# Patient Record
Sex: Female | Born: 1985 | Race: Black or African American | Hispanic: No | Marital: Single | Smoking: Current some day smoker
Health system: Southern US, Community
[De-identification: ages and names within clinical notes are randomized; demographics above are authoritative.]

## PROBLEM LIST (undated history)

## (undated) ENCOUNTER — Ambulatory Visit: Payer: 59 | Source: Home / Self Care

## (undated) DIAGNOSIS — Z8489 Family history of other specified conditions: Secondary | ICD-10-CM

## (undated) DIAGNOSIS — E079 Disorder of thyroid, unspecified: Secondary | ICD-10-CM

## (undated) DIAGNOSIS — K219 Gastro-esophageal reflux disease without esophagitis: Secondary | ICD-10-CM

## (undated) DIAGNOSIS — J302 Other seasonal allergic rhinitis: Secondary | ICD-10-CM

## (undated) DIAGNOSIS — R519 Headache, unspecified: Secondary | ICD-10-CM

## (undated) DIAGNOSIS — R7303 Prediabetes: Secondary | ICD-10-CM

## (undated) DIAGNOSIS — J189 Pneumonia, unspecified organism: Secondary | ICD-10-CM

## (undated) DIAGNOSIS — F419 Anxiety disorder, unspecified: Secondary | ICD-10-CM

## (undated) DIAGNOSIS — F32A Depression, unspecified: Secondary | ICD-10-CM

## (undated) DIAGNOSIS — D573 Sickle-cell trait: Secondary | ICD-10-CM

---

## 2007-02-13 HISTORY — PX: DILATION AND CURETTAGE OF UTERUS: SHX78

## 2009-11-21 ENCOUNTER — Emergency Department (HOSPITAL_BASED_OUTPATIENT_CLINIC_OR_DEPARTMENT_OTHER): Admission: EM | Admit: 2009-11-21 | Discharge: 2009-11-21 | Payer: Self-pay | Admitting: Emergency Medicine

## 2010-02-15 ENCOUNTER — Emergency Department (HOSPITAL_BASED_OUTPATIENT_CLINIC_OR_DEPARTMENT_OTHER)
Admission: EM | Admit: 2010-02-15 | Discharge: 2010-02-15 | Payer: Self-pay | Source: Home / Self Care | Admitting: Emergency Medicine

## 2011-03-24 ENCOUNTER — Emergency Department (HOSPITAL_BASED_OUTPATIENT_CLINIC_OR_DEPARTMENT_OTHER)
Admission: EM | Admit: 2011-03-24 | Discharge: 2011-03-25 | Disposition: A | Discharge status: Home / Self Care | Payer: PRIVATE HEALTH INSURANCE | Attending: Emergency Medicine | Admitting: Emergency Medicine

## 2011-03-24 ENCOUNTER — Encounter (HOSPITAL_BASED_OUTPATIENT_CLINIC_OR_DEPARTMENT_OTHER): Payer: Self-pay | Admitting: *Deleted

## 2011-03-24 ENCOUNTER — Emergency Department (INDEPENDENT_AMBULATORY_CARE_PROVIDER_SITE_OTHER): Payer: PRIVATE HEALTH INSURANCE

## 2011-03-24 DIAGNOSIS — J189 Pneumonia, unspecified organism: Secondary | ICD-10-CM

## 2011-03-24 DIAGNOSIS — J45909 Unspecified asthma, uncomplicated: Secondary | ICD-10-CM | POA: Insufficient documentation

## 2011-03-24 DIAGNOSIS — R05 Cough: Secondary | ICD-10-CM

## 2011-03-24 DIAGNOSIS — R0609 Other forms of dyspnea: Secondary | ICD-10-CM

## 2011-03-24 DIAGNOSIS — Z79899 Other long term (current) drug therapy: Secondary | ICD-10-CM | POA: Insufficient documentation

## 2011-03-24 DIAGNOSIS — J111 Influenza due to unidentified influenza virus with other respiratory manifestations: Secondary | ICD-10-CM | POA: Insufficient documentation

## 2011-03-24 DIAGNOSIS — R918 Other nonspecific abnormal finding of lung field: Secondary | ICD-10-CM

## 2011-03-24 HISTORY — DX: Other seasonal allergic rhinitis: J30.2

## 2011-03-24 HISTORY — DX: Sickle-cell trait: D57.3

## 2011-03-24 LAB — URINALYSIS, ROUTINE W REFLEX MICROSCOPIC
Glucose, UA: NEGATIVE mg/dL
Protein, ur: NEGATIVE mg/dL

## 2011-03-24 LAB — PREGNANCY, URINE: Preg Test, Ur: NEGATIVE

## 2011-03-24 LAB — URINE MICROSCOPIC-ADD ON

## 2011-03-24 MED ORDER — NAPROXEN 250 MG PO TABS
500.0000 mg | ORAL_TABLET | Freq: Once | ORAL | Status: AC
  Administered 2011-03-25: 500 mg via ORAL

## 2011-03-24 MED ORDER — ALBUTEROL SULFATE (5 MG/ML) 0.5% IN NEBU
5.0000 mg | INHALATION_SOLUTION | Freq: Once | RESPIRATORY_TRACT | Status: AC
  Administered 2011-03-24: 5 mg via RESPIRATORY_TRACT
  Filled 2011-03-24: qty 1

## 2011-03-24 MED ORDER — ALBUTEROL SULFATE HFA 108 (90 BASE) MCG/ACT IN AERS
2.0000 | INHALATION_SPRAY | RESPIRATORY_TRACT | Status: DC | PRN
  Administered 2011-03-24: 2 via RESPIRATORY_TRACT
  Filled 2011-03-24: qty 6.7

## 2011-03-24 MED ORDER — IPRATROPIUM BROMIDE 0.02 % IN SOLN
0.5000 mg | Freq: Once | RESPIRATORY_TRACT | Status: AC
  Administered 2011-03-24: 0.5 mg via RESPIRATORY_TRACT
  Filled 2011-03-24: qty 2.5

## 2011-03-24 MED ORDER — AZITHROMYCIN 250 MG PO TABS: ORAL_TABLET | ORAL | Status: DC

## 2011-03-24 NOTE — ED Notes (Signed)
Pt has had cough, sneezing, body aches,N/V, temp 101-104 since Monday. Son was also sick with similar s/s.

## 2011-03-24 NOTE — ED Provider Notes (Addendum)
History    Scribed for Hanley Seamen, MD, the patient was seen in room MH10/MH10. This chart was scribed by Katha Cabal.   CSN: [id number]  Arrival date & time 03/24/11  2035   First MD Initiated Contact with Patient 03/24/11 2311      Chief Complaint  Patient presents with  . Cough    (Consider location/radiation/quality/duration/timing/severity/associated sxs/prior treatment) Patient is a 26 y.o. female presenting with cough. The history is provided by the patient and a parent. No language interpreter was used.  Cough This is a new problem. Episode onset: about 6 days ago. The problem occurs every few minutes. The problem has not changed since onset.The maximum temperature recorded prior to her arrival was 102 to 102.9 F. The fever has been present for 5 days or more. Associated symptoms include rhinorrhea, myalgias and shortness of breath. Associated symptoms comments: Upper upper back pain, sneezing, congestion, . Her past medical history is significant for asthma.   Patient reports pleuritic pain.  There has been no diarrhea or changes in appetite.  Patient's mother reports that the patient's son was sick recently with similar symptoms and was seen in ED.     Past Medical History  Diagnosis Date  . Asthma   . Seasonal allergies   . Sickle cell trait     History reviewed. No pertinent past surgical history.  History reviewed. No pertinent family history.  History  Substance Use Topics  . Smoking status: Never Smoker   . Smokeless tobacco: Not on file  . Alcohol Use: No    OB History    Grav Para Term Preterm Abortions TAB SAB Ect Mult Living                  Review of Systems  HENT: Positive for rhinorrhea.   Respiratory: Positive for cough and shortness of breath.   Gastrointestinal: Positive for nausea. Negative for diarrhea.  Musculoskeletal: Positive for myalgias.  All other systems reviewed and are negative.    Allergies  Chocolate and Wine  Home  Medications   Current Outpatient Rx  Name Route Sig Dispense Refill  . ALBUTEROL SULFATE HFA 108 (90 BASE) MCG/ACT IN AERS Inhalation Inhale 2 puffs into the lungs every 6 (six) hours as needed. For shortness of breath    . GUAIFENESIN-DM 100-10 MG/5ML PO SYRP Oral Take 10 mLs by mouth 3 (three) times daily as needed. For cough    . MONTELUKAST SODIUM 10 MG PO TABS Oral Take 10 mg by mouth at bedtime.    . ADULT MULTIVITAMIN W/MINERALS CH Oral Take 1 tablet by mouth daily.    Marland Kitchen NAPROXEN SODIUM 220 MG PO TABS Oral Take 440 mg by mouth once as needed. For pain    . PHENYLEPH-CPM-DM-APAP 06-13-08-325 MG PO CAPS Oral Take 2 capsules by mouth once as needed. For cold symptoms    . PHENYLEPH-CPM-DM-ASPIRIN 7.09-13-08-325 MG PO TBEF Oral Take 2 tablets by mouth once as needed. For cold symptoms      BP 104/57  Pulse 76  Temp(Src) 99.6 F (37.6 C) (Oral)  Resp 18  Ht 5' (1.524 m)  Wt 155 lb (70.308 kg)  BMI 30.27 kg/m2  SpO2 98%  LMP 03/04/2011  Physical Exam   General: Well-developed, well-nourished female in no acute distress; appearance consistent with age of record HENT: normocephalic, atraumatic, Mild dysphonia, mild pharyngeal erythema without exudate,  Eyes: pupils equal round and reactive to light; extraocular muscles intact Neck: supple Heart: regular rate  and rhythm; no murmurs, rubs or gallops Lungs:  decreased air movement without frank wheezing,  Abdomen: soft; nondistended; nontender; no masses or hepatosplenomegaly; bowel sounds present Extremities: No deformity; full range of motion; pulses normal Neurologic: Awake, alert and oriented; motor function intact in all extremities and symmetric; no facial droop Skin: Warm and dry Psychiatric: Normal mood and affect   ED Course  Procedures (including critical care time)   DIAGNOSTIC STUDIES: Oxygen Saturation is 98% on room air, normal by my interpretation.     COORDINATION OF CARE: 11:14 PM  Advised patient of  radiological findings.   11:18 PM  Physical exam complete.   11:30 PM  Proventil and Atrovent nebulizer treatment ordered.        MDM   Nursing notes and vitals signs, including pulse oximetry, reviewed.  Summary of this visit's results, reviewed by myself:  Labs:  Results for orders placed during the hospital encounter of 03/24/11  URINALYSIS, ROUTINE W REFLEX MICROSCOPIC      Component Value Range   Color, Urine YELLOW  YELLOW    APPearance CLEAR  CLEAR    Specific Gravity, Urine 1.017  1.005 - 1.030    pH 5.5  5.0 - 8.0    Glucose, UA NEGATIVE  NEGATIVE (mg/dL)   Hgb urine dipstick NEGATIVE  NEGATIVE    Bilirubin Urine NEGATIVE  NEGATIVE    Ketones, ur NEGATIVE  NEGATIVE (mg/dL)   Protein, ur NEGATIVE  NEGATIVE (mg/dL)   Urobilinogen, UA 0.2  0.0 - 1.0 (mg/dL)   Nitrite NEGATIVE  NEGATIVE    Leukocytes, UA TRACE (*) NEGATIVE   PREGNANCY, URINE      Component Value Range   Preg Test, Ur NEGATIVE  NEGATIVE   URINE MICROSCOPIC-ADD ON      Component Value Range   Squamous Epithelial / LPF FEW (*) RARE    WBC, UA 3-6  <3 (WBC/hpf)   RBC / HPF 0-2  <3 (RBC/hpf)   Bacteria, UA RARE  RARE     Imaging Studies: Dg Chest 2 View  03/24/2011  *RADIOLOGY REPORT*  Clinical Data: Cough.  Difficulty breathing.  CHEST - 2 VIEW  Comparison: None.  Findings: Shallow inspiration.  Normal heart size and pulmonary vascularity.  There is a focal airspace disease in the left lower lung fairly, obscuring a portion of the left hemidiaphragm.  This suggests focal pneumonia.  No pleural effusion.  No pneumothorax.  IMPRESSION: Focal area of infiltration in the anterior left lower lung.  Original Report Authenticated By: Marlon Pel, M.D.    I personally performed the services described in this documentation, which was scribed in my presence.  The recorded information has been reviewed and considered.       MEDICATIONS GIVEN IN THE E.D. Scheduled Meds:    . albuterol  5 mg  Nebulization Once  . ipratropium  0.5 mg Nebulization Once   11:41 PM Improved air movement her albuterol and Atrovent neb treatment.         Hanley Seamen, MD 03/24/11 2337  Hanley Seamen, MD 03/24/11 [phone]

## 2011-03-25 MED ORDER — NAPROXEN 250 MG PO TABS
ORAL_TABLET | ORAL | Status: AC
  Administered 2011-03-25: 500 mg via ORAL
  Filled 2011-03-25: qty 2

## 2011-03-26 ENCOUNTER — Telehealth: Payer: Self-pay | Admitting: Family Medicine

## 2011-03-26 NOTE — Telephone Encounter (Signed)
To: [facility] (Daytime Triage) Fax: [phone] From: [facility] Date/ Time: 03/26/2011 9:27 AM Taken By: Thomasena Edis, CSR Caller: Alvy Beal Facility: not collected Patient: Monica Sloan, Monica Sloan DOB: 1986/01/10 Phone: [phone] Reason for Call: Patient was seen in the ED for Pneumonia and her work needs a more specific note for light duty she has a follow up on the 5th of March and wants to know about getting a note for her employer. Regarding Appointment: Appt Date: Appt Time: Unknown Provider: Reason: Details

## 2011-03-26 NOTE — Telephone Encounter (Signed)
msg left to call the office     KP 

## 2011-03-26 NOTE — Telephone Encounter (Signed)
Pt may need f/u here

## 2011-03-26 NOTE — Telephone Encounter (Signed)
Pt called back stating that she needs a note for work that specifies her work limitation due to recent illness. Pt scheduled to come in to office on tomorrow for evaluation.

## 2011-03-27 ENCOUNTER — Ambulatory Visit (INDEPENDENT_AMBULATORY_CARE_PROVIDER_SITE_OTHER): Payer: PRIVATE HEALTH INSURANCE | Admitting: Family Medicine

## 2011-03-27 ENCOUNTER — Telehealth: Payer: Self-pay | Admitting: Family Medicine

## 2011-03-27 ENCOUNTER — Encounter: Payer: Self-pay | Admitting: Family Medicine

## 2011-03-27 VITALS — BP 110/72 | HR 80 | Temp 98.9°F | Ht 62.0 in | Wt 164.8 lb

## 2011-03-27 DIAGNOSIS — R0789 Other chest pain: Secondary | ICD-10-CM

## 2011-03-27 DIAGNOSIS — J45909 Unspecified asthma, uncomplicated: Secondary | ICD-10-CM

## 2011-03-27 DIAGNOSIS — J189 Pneumonia, unspecified organism: Secondary | ICD-10-CM

## 2011-03-27 MED ORDER — MONTELUKAST SODIUM 10 MG PO TABS: 10.0000 mg | ORAL_TABLET | Freq: Every day | ORAL | Status: DC

## 2011-03-27 MED ORDER — FLUTICASONE-SALMETEROL 250-50 MCG/DOSE IN AEPB: 1.0000 | INHALATION_SPRAY | Freq: Two times a day (BID) | RESPIRATORY_TRACT | Status: DC

## 2011-03-27 MED ORDER — METHYLPREDNISOLONE ACETATE PF 80 MG/ML IJ SUSP
80.0000 mg | Freq: Once | INTRAMUSCULAR | Status: AC
  Administered 2011-03-27: 80 mg via INTRAMUSCULAR

## 2011-03-27 MED ORDER — AZITHROMYCIN 250 MG PO TABS: ORAL_TABLET | ORAL | Status: DC

## 2011-03-27 MED ORDER — ALBUTEROL SULFATE (5 MG/ML) 0.5% IN NEBU
2.5000 mg | INHALATION_SOLUTION | Freq: Once | RESPIRATORY_TRACT | Status: AC
  Administered 2011-03-27: 2.5 mg via RESPIRATORY_TRACT

## 2011-03-27 MED ORDER — HYDROCOD POLST-CHLORPHEN POLST 10-8 MG/5ML PO LQCR: ORAL | Status: DC

## 2011-03-27 NOTE — Telephone Encounter (Signed)
Faxed.   KP 

## 2011-03-27 NOTE — Telephone Encounter (Signed)
i gave her a note at [facility]

## 2011-03-27 NOTE — Progress Notes (Signed)
  Subjective:     Monica Sloan is a 26 y.o. female here for evaluation of a cough. Onset of symptoms was couple weeks ago. Symptoms have been gradually worsening since that time. The cough is productive and is aggravated by exercise and reclining position. Associated symptoms include: chills, fever, shortness of breath, sputum production and wheezing. Patient does have a history of asthma. Patient does have a history of environmental allergens. Patient has not traveled recently. Patient does not have a history of smoking. Patient has had a previous chest x-ray. Patient has not had a PPD done.  The following portions of the patient's history were reviewed and updated as appropriate: allergies, current medications, past family history, past medical history, past social history, past surgical history and problem list.  Review of Systems Pertinent items are noted in HPI.    Objective:    Oxygen saturation 97% on room air BP 110/72  Pulse 80  Temp(Src) 98.9 F (37.2 C) (Oral)  Ht 5\' 2"  (1.575 m)  Wt 164 lb 12.8 oz (74.753 kg)  BMI 30.14 kg/m2  SpO2 97%  LMP 03/04/2011 General appearance: alert, cooperative, appears stated age and mild distress Ears: normal TM's and external ear canals both ears Nose: Nares normal. Septum midline. Mucosa normal. No drainage or sinus tenderness. Throat: lips, mucosa, and tongue normal; teeth and gums normal Neck: mild anterior cervical adenopathy, supple, symmetrical, trachea midline and thyroid not enlarged, symmetric, no tenderness/mass/nodules Lungs: diminished breath sounds bilaterally and wheezes bilaterally Heart: S1, S2 normal    Assessment:    Asthma and Pneumonia    Plan:    Antibiotics per medication orders. Antitussives per medication orders. Avoid exposure to tobacco smoke and fumes. B-agonist inhaler. Call if shortness of breath worsens, blood in sputum, change in character of cough, development of fever or chills, inability to  maintain nutrition and hydration. Avoid exposure to tobacco smoke and fumes. Chest x-ray. Steroid inhaler as ordered. depo medrol

## 2011-03-27 NOTE — Telephone Encounter (Signed)
To: [facility] (Daytime Triage) Fax: [phone] From: [facility] Date/ Time: 03/27/2011 12:28 PM Taken By: Patria Mane, CSR Caller: [id number] Facility: not collected Patient: Monica, Sloan DOB: May 09, 1985 Phone: [phone] Reason for Call: Pt was seen earlier today and is calling back with a fax # to send her doctors note, stating that she will be out of work for 1 week. Please fax to [phone]; AttnLynden Ang Regarding Appointment: Appt Date: Appt Time: Unknown Provider: Reason: Details

## 2011-03-27 NOTE — Patient Instructions (Signed)

## 2011-03-30 ENCOUNTER — Telehealth: Payer: Self-pay | Admitting: Family Medicine

## 2011-03-30 NOTE — Telephone Encounter (Signed)
[facility] (Daytime Triage) Fax: [phone] From: [facility] Date/ Time: 03/30/2011 10:26 AM Taken By: Annalee Genta, CSR Caller: Alvy Beal Facility: not collected Patient: Monica Sloan, Monica Sloan DOB: Jan 29, 1986 Phone: [phone] Reason for Call: Please call pt to advise if she can get note from Dr advising that when she returns to work Mon 04/02/11, she will need to be on light duty due to her recovering from pneumonia. Regarding Appointment: Appt Date: Appt Time: Unknown Provider: Reason: Details: Outcome

## 2011-03-30 NOTE — Telephone Encounter (Signed)
Letter complete, patient will come to the office to pick it up     KP

## 2011-03-30 NOTE — Telephone Encounter (Signed)
Discussed with patient and she will need a letter putting her on light duty since she still is not feeling better from pneumonia, she stated she coughs every once in a while, she is on currently on the Z-Pak but still having some chest tightness, she is using inhaler every 4 hours as directed, denied SOB until over exerted. She wanted to be on light duty because she has to lift and push boxes at work.       KP

## 2011-03-30 NOTE — Telephone Encounter (Signed)
Light duty for a week

## 2011-04-02 ENCOUNTER — Telehealth: Payer: Self-pay | Admitting: Family Medicine

## 2011-04-02 ENCOUNTER — Other Ambulatory Visit: Payer: Self-pay

## 2011-04-02 MED ORDER — ALBUTEROL SULFATE HFA 108 (90 BASE) MCG/ACT IN AERS: 2.0000 | INHALATION_SPRAY | Freq: Four times a day (QID) | RESPIRATORY_TRACT | Status: DC | PRN

## 2011-04-02 NOTE — Telephone Encounter (Addendum)
Monica Sloan from [employer], patient's employer, needs more detailed restrictions for this patient.  Patient's actual job description has been faxed to [place].  Patient is at work right now and can not clock in until this is cleared up.

## 2011-04-02 NOTE — Telephone Encounter (Signed)
Letter faxed.      KP 

## 2011-04-02 NOTE — Telephone Encounter (Signed)
Rx faxed to the pharmacy.    KP 

## 2011-04-02 NOTE — Telephone Encounter (Signed)
I spoke with patient while she is still at work.  She states she will go ahead and return home for today, but would like to return to work tomorrow, 04-03-11.

## 2011-04-05 ENCOUNTER — Ambulatory Visit (INDEPENDENT_AMBULATORY_CARE_PROVIDER_SITE_OTHER): Payer: PRIVATE HEALTH INSURANCE | Admitting: Family Medicine

## 2011-04-05 ENCOUNTER — Telehealth: Payer: Self-pay | Admitting: *Deleted

## 2011-04-05 ENCOUNTER — Telehealth: Payer: Self-pay | Admitting: Family Medicine

## 2011-04-05 ENCOUNTER — Encounter: Payer: Self-pay | Admitting: Family Medicine

## 2011-04-05 ENCOUNTER — Ambulatory Visit (HOSPITAL_BASED_OUTPATIENT_CLINIC_OR_DEPARTMENT_OTHER)
Admission: RE | Admit: 2011-04-05 | Discharge: 2011-04-05 | Disposition: A | Discharge status: Home / Self Care | Payer: No Typology Code available for payment source | Source: Ambulatory Visit | Attending: Family Medicine | Admitting: Family Medicine

## 2011-04-05 VITALS — BP 120/80 | HR 90 | Temp 98.6°F | Wt 167.0 lb

## 2011-04-05 DIAGNOSIS — J189 Pneumonia, unspecified organism: Secondary | ICD-10-CM

## 2011-04-05 DIAGNOSIS — R0989 Other specified symptoms and signs involving the circulatory and respiratory systems: Secondary | ICD-10-CM | POA: Insufficient documentation

## 2011-04-05 DIAGNOSIS — R0602 Shortness of breath: Secondary | ICD-10-CM | POA: Insufficient documentation

## 2011-04-05 DIAGNOSIS — R062 Wheezing: Secondary | ICD-10-CM

## 2011-04-05 MED ORDER — ALBUTEROL SULFATE (2.5 MG/3ML) 0.083% IN NEBU: 2.5000 mg | INHALATION_SOLUTION | Freq: Four times a day (QID) | RESPIRATORY_TRACT | Status: DC | PRN

## 2011-04-05 MED ORDER — PREDNISONE 10 MG PO TABS: ORAL_TABLET | ORAL | Status: DC

## 2011-04-05 MED ORDER — ALBUTEROL SULFATE (5 MG/ML) 0.5% IN NEBU
2.5000 mg | INHALATION_SOLUTION | Freq: Once | RESPIRATORY_TRACT | Status: AC
  Administered 2011-04-05: 2.5 mg via RESPIRATORY_TRACT

## 2011-04-05 MED ORDER — COMPAIR NEBULIZER MISC: Status: DC

## 2011-04-05 MED ORDER — MOXIFLOXACIN HCL 400 MG PO TABS: 400.0000 mg | ORAL_TABLET | Freq: Every day | ORAL | Status: AC

## 2011-04-05 NOTE — Progress Notes (Signed)
  Subjective:     Monica Sloan is a 26 y.o. female here for evaluation of a cough. Onset of symptoms was 3 weeks ago. Symptoms have been waxing and waning since that time. The cough is dry and painful and is aggravated by dust, exercise, fumes, infection, pollens and reclining position. Associated symptoms include: chest pain, shortness of breath and wheezing. Patient does have a history of asthma. Patient does have a history of environmental allergens. Patient has not traveled recently. Patient does not have a history of smoking. Patient has had a previous chest x-ray. Patient has not had a PPD done.  The following portions of the patient's history were reviewed and updated as appropriate: allergies, current medications, past family history, past medical history, past social history, past surgical history and problem list.  Review of Systems Pertinent items are noted in HPI.    Objective:     BP 120/80  Pulse 90  Temp(Src) 98.6 F (37 C) (Oral)  Wt 167 lb (75.751 kg)  SpO2 98%  LMP 03/04/2011 General appearance: alert, cooperative, appears stated age and mild distress Ears: normal TM's and external ear canals both ears Nose: Nares normal. Septum midline. Mucosa normal. No drainage or sinus tenderness. Throat: lips, mucosa, and tongue normal; teeth and gums normal Neck: mild anterior cervical adenopathy, no JVD, supple, symmetrical, trachea midline and thyroid not enlarged, symmetric, no tenderness/mass/nodules Lungs: diminished breath sounds bilaterally    Assessment:    Pneumonia and symptoms returned    Plan:    Antibiotics per medication orders. Antitussives per medication orders. Avoid exposure to tobacco smoke and fumes. B-agonist inhaler. Call if shortness of breath worsens, blood in sputum, change in character of cough, development of fever or chills, inability to maintain nutrition and hydration. Avoid exposure to tobacco smoke and fumes. Chest x-ray. Steroid  inhaler as ordered. Trial of antihistamines.

## 2011-04-05 NOTE — Patient Instructions (Signed)

## 2011-04-05 NOTE — Telephone Encounter (Signed)
[facility] Triage Call Report Triage Record Num: [id number] Operator: Jeraldine Loots Patient Name: Monica Sloan Call Date & Time: 04/05/2011 10:56:55AM Patient Phone: [phone] PCP: Lelon Perla Patient Gender: Female PCP Fax : [phone] Patient DOB: Aug 06, 1985 Practice Name: [facility] Reason for Call: Caller: [name]/Patient; PCP: Lelon Perla.; CB#: [phone]; Dx with pneumonia on 2/9 in the ED. Was put on a Zpac and an inhaler. Was seen in the office on 2/11 and given an additional Zpac. Just completed same on 2/20. Has returned to work with restrictions and has noticed that after returning to work that she worse in the am. Has tightness in the left lung/chest area. Sometimes when she takes a deep breath, the left lung 'is pinching'. Scheduled for 215 today with Dr. Laury Axon. Protocol(s) Used: Asthma - Adult Recommended Outcome per Protocol: Call Provider Immediately Reason for Outcome: Currently wheezing, having chest tightness, or labored breathing AND has been hospitalized or has had ED care

## 2011-04-05 NOTE — Telephone Encounter (Signed)
Called Pt back and transferred her to [facility].

## 2011-04-05 NOTE — Telephone Encounter (Signed)
Patient was seen today with Dr.Lowne.     KP

## 2011-04-09 ENCOUNTER — Telehealth: Payer: Self-pay | Admitting: Family Medicine

## 2011-04-09 NOTE — Telephone Encounter (Signed)
To Dr Lowne 

## 2011-04-09 NOTE — Telephone Encounter (Signed)
Patient called to let us know she is feeling better. Would like to know if she should continue taking the antibiotic and prednisone.

## 2011-04-09 NOTE — Telephone Encounter (Signed)
Please advise 

## 2011-04-09 NOTE — Telephone Encounter (Signed)
Yes 0--- she needs to finish both

## 2011-04-10 ENCOUNTER — Ambulatory Visit: Payer: PRIVATE HEALTH INSURANCE | Admitting: Family Medicine

## 2011-04-10 DIAGNOSIS — Z0289 Encounter for other administrative examinations: Secondary | ICD-10-CM

## 2011-04-17 ENCOUNTER — Ambulatory Visit (INDEPENDENT_AMBULATORY_CARE_PROVIDER_SITE_OTHER): Payer: PRIVATE HEALTH INSURANCE | Admitting: Family Medicine

## 2011-04-17 ENCOUNTER — Other Ambulatory Visit (HOSPITAL_COMMUNITY)
Admission: RE | Admit: 2011-04-17 | Discharge: 2011-04-17 | Disposition: A | Discharge status: Home / Self Care | Payer: No Typology Code available for payment source | Source: Ambulatory Visit | Attending: Family Medicine | Admitting: Family Medicine

## 2011-04-17 ENCOUNTER — Encounter: Payer: Self-pay | Admitting: Family Medicine

## 2011-04-17 VITALS — BP 110/72 | HR 72 | Temp 98.6°F | Ht 62.0 in | Wt 167.4 lb

## 2011-04-17 DIAGNOSIS — J4520 Mild intermittent asthma, uncomplicated: Secondary | ICD-10-CM | POA: Insufficient documentation

## 2011-04-17 DIAGNOSIS — J45909 Unspecified asthma, uncomplicated: Secondary | ICD-10-CM

## 2011-04-17 DIAGNOSIS — Z0000 Encounter for general adult medical examination without abnormal findings: Secondary | ICD-10-CM

## 2011-04-17 DIAGNOSIS — Z124 Encounter for screening for malignant neoplasm of cervix: Secondary | ICD-10-CM

## 2011-04-17 DIAGNOSIS — B373 Candidiasis of vulva and vagina: Secondary | ICD-10-CM

## 2011-04-17 DIAGNOSIS — Z01419 Encounter for gynecological examination (general) (routine) without abnormal findings: Secondary | ICD-10-CM | POA: Insufficient documentation

## 2011-04-17 LAB — BASIC METABOLIC PANEL
BUN: 12 mg/dL (ref 6–23)
Chloride: 102 mEq/L (ref 96–112)
Glucose, Bld: 87 mg/dL (ref 70–99)
Potassium: 3.7 mEq/L (ref 3.5–5.1)

## 2011-04-17 LAB — POCT URINALYSIS DIPSTICK
Blood, UA: NEGATIVE
Glucose, UA: NEGATIVE
Protein, UA: NEGATIVE
Spec Grav, UA: 1.02
Urobilinogen, UA: 0.2

## 2011-04-17 LAB — LIPID PANEL
Cholesterol: 143 mg/dL (ref 0–200)
LDL Cholesterol: 64 mg/dL (ref 0–99)
Triglycerides: 30 mg/dL (ref 0.0–149.0)
VLDL: 6 mg/dL (ref 0.0–40.0)

## 2011-04-17 LAB — HEPATIC FUNCTION PANEL: Total Bilirubin: 0.5 mg/dL (ref 0.3–1.2)

## 2011-04-17 LAB — CBC WITH DIFFERENTIAL/PLATELET
Basophils Absolute: 0.1 10*3/uL (ref 0.0–0.1)
HCT: 37.7 % (ref 36.0–46.0)
Lymphs Abs: 2.1 10*3/uL (ref 0.7–4.0)
MCV: 94.4 fl (ref 78.0–100.0)
Monocytes Absolute: 0.5 10*3/uL (ref 0.1–1.0)
Platelets: 243 10*3/uL (ref 150.0–400.0)
RDW: 15 % — ABNORMAL HIGH (ref 11.5–14.6)

## 2011-04-17 MED ORDER — FLUCONAZOLE 150 MG PO TABS: ORAL_TABLET | ORAL | Status: DC

## 2011-04-17 NOTE — Progress Notes (Signed)
nnnnnnnnnnnnnnnnnnnnnnnnnnnnnnnnnnnnnSubjective:     Vail Basista is a 26 y.o. female and is here for a comprehensive physical exam. The patient reports no problems.  History   Social History  . Marital Status: Single    Spouse Name: N/A    Number of Children: N/A  . Years of Education: N/A   Occupational History  . [employer]    Social History Main Topics  . Smoking status: Former Smoker    Types: Cigarettes    Quit date: 03/23/2011  . Smokeless tobacco: Never Used  . Alcohol Use: Yes     Occasional  . Drug Use: No  . Sexually Active: No   Other Topics Concern  . Not on file   Social History Narrative   Exercise ---  Running around at work   Health Maintenance  Topic Date Due  . Influenza Vaccine  11/13/2011  . Pap Smear  02/18/2012  . Tetanus/tdap  04/13/2018    The following portions of the patient's history were reviewed and updated as appropriate: allergies, current medications, past family history, past medical history, past social history, past surgical history and problem list.  Review of Systems Review of Systems  Constitutional: Negative for activity change, appetite change and fatigue.  HENT: Negative for hearing loss, congestion, tinnitus and ear discharge.  dentist-- due Eyes: Negative for visual disturbance (see optho q1y --due) Respiratory: Negative for cough, chest tightness and shortness of breath.   Cardiovascular: Negative for chest pain, palpitations and leg swelling.  Gastrointestinal: Negative for abdominal pain, diarrhea, constipation and abdominal distention.  Genitourinary: Negative for urgency, frequency, decreased urine volume and difficulty urinating.  Musculoskeletal: Negative for back pain, arthralgias and gait problem.  Skin: Negative for color change, pallor and rash.  Neurological:  Negative for dizziness, light-headedness, numbness and headaches.  Hematological: Negative for adenopathy. Does not bruise/bleed easily.  Psychiatric/Behavioral: Negative for suicidal ideas, confusion, sleep disturbance, self-injury, dysphoric mood, decreased concentration and agitation.       Objective:    BP 110/72  Pulse 72  Temp(Src) 98.6 F (37 C) (Oral)  Ht 5\' 2"  (1.575 m)  Wt 167 lb 6.4 oz (75.932 kg)  BMI 30.62 kg/m2  SpO2 99%  LMP 03/29/2011 General appearance: alert, cooperative, appears stated age and no distress Head: Normocephalic, without obvious abnormality, atraumatic Eyes: conjunctivae/corneas clear. PERRL, EOM's intact. Fundi benign. Ears: normal TM's and external ear canals both ears Nose: Nares normal. Septum midline. Mucosa normal. No drainage or sinus tenderness. Throat: lips, mucosa, and tongue normal; teeth and gums normal Neck: no adenopathy, no carotid bruit, no JVD, supple, symmetrical, trachea midline and thyroid not enlarged, symmetric, no tenderness/mass/nodules Back: symmetric, no curvature. ROM  normal. No CVA tenderness. Lungs: clear to auscultation bilaterally Breasts: normal appearance, no masses or tenderness Heart: regular rate and rhythm, S1, S2 normal, no murmur, click, rub or gallop Abdomen: soft, non-tender; bowel sounds normal; no masses,  no organomegaly Pelvic: cervix normal in appearance, external genitalia normal, no adnexal masses or tenderness, no cervical motion tenderness, rectovaginal septum normal, uterus normal size, shape, and consistency and vagina normal without discharge Extremities: extremities normal, atraumatic, no cyanosis or edema Pulses: 2+ and symmetric Skin: Skin color, texture, turgor normal. No rashes or lesions Lymph nodes: Cervical, supraclavicular, and axillary nodes normal. Neurologic: Alert and oriented X 3, normal strength and tone. Normal symmetric reflexes. Normal coordination and gait psych -- no  depression, anxiety    Assessment:    Healthy female exam.  Asthma---controlled.  Cont meds     Plan:    ghm utd Check fasting labs See After Visit Summary for Counseling Recommendations   Subjective:     Reola Buckles is an 26 y.o. female who presents for follow up of asthma. The patient is not currently have symptoms / an exacerbation. The patient has been having episodes for approximately all her life. Symptoms in previous episodes have included chest tightness, dyspnea, non-productive cough and wheezing, and typically last several days. Previous episodes have been triggered by upper respiratory infection. Treatments tried during prior episodes include inhaled corticosteroids, long-acting inhaled beta-adrenergic agonists and short-acting inhaled beta-adrenergic agonists, which usually provides complete resolution of symptoms.   Current Disease Severity Kamree has no daytime asthma symptoms. She has no nighttime asthma symptoms. The patient is using short-acting beta agonists for symptom control several times per day. She has exacerbations requiring oral systemic corticosteroids 1 times per year. Current limitations in activity from asthma: none. Number of days of school or work missed in the last month: 21. Number of urgent/emergent visit in last year: 1   The following portions of the patient's history were reviewed and updated as appropriate: allergies, current medications, past family history, past medical history, past social history, past surgical history and problem list.  Review of Systems Pertinent items are noted in HPI.    Objective:    see cpe    Assessment:    Severe persistent asthma, improved.     Plan:    Medications: no change. Discussed medication dosage, use, side effects, and goals of treatment in detail.   Asthma information handout given.

## 2011-04-17 NOTE — Patient Instructions (Signed)
Preventive Care for Adults, Female A healthy lifestyle and preventive care can promote health and wellness. Preventive health guidelines for women include the following key practices.  A routine yearly physical is a good way to check with your caregiver about your health and preventive screening. It is a chance to share any concerns and updates on your health, and to receive a thorough exam.   Visit your dentist for a routine exam and preventive care every 6 months. Brush your teeth twice a day and floss once a day. Good oral hygiene prevents tooth decay and gum disease.   The frequency of eye exams is based on your age, health, family medical history, use of contact lenses, and other factors. Follow your caregiver's recommendations for frequency of eye exams.   Eat a healthy diet. Foods like vegetables, fruits, whole grains, low-fat dairy products, and lean protein foods contain the nutrients you need without too many calories. Decrease your intake of foods high in solid fats, added sugars, and salt. Eat the right amount of calories for you.Get information about a proper diet from your caregiver, if necessary.   Regular physical exercise is one of the most important things you can do for your health. Most adults should get at least 150 minutes of moderate-intensity exercise (any activity that increases your heart rate and causes you to sweat) each week. In addition, most adults need muscle-strengthening exercises on 2 or more days a week.   Maintain a healthy weight. The body mass index (BMI) is a screening tool to identify possible weight problems. It provides an estimate of body fat based on height and weight. Your caregiver can help determine your BMI, and can help you achieve or maintain a healthy weight.For adults 20 years and older:   A BMI below 18.5 is considered underweight.   A BMI of 18.5 to 24.9 is normal.   A BMI of 25 to 29.9 is considered overweight.   A BMI of 30 and above is  considered obese.   Maintain normal blood lipids and cholesterol levels by exercising and minimizing your intake of saturated fat. Eat a balanced diet with plenty of fruit and vegetables. Blood tests for lipids and cholesterol should begin at age 20 and be repeated every 5 years. If your lipid or cholesterol levels are high, you are over 50, or you are at high risk for heart disease, you may need your cholesterol levels checked more frequently.Ongoing high lipid and cholesterol levels should be treated with medicines if diet and exercise are not effective.   If you smoke, find out from your caregiver how to quit. If you do not use tobacco, do not start.   If you are pregnant, do not drink alcohol. If you are breastfeeding, be very cautious about drinking alcohol. If you are not pregnant and choose to drink alcohol, do not exceed 1 drink per day. One drink is considered to be 12 ounces (355 mL) of beer, 5 ounces (148 mL) of wine, or 1.5 ounces (44 mL) of liquor.   Avoid use of street drugs. Do not share needles with anyone. Ask for help if you need support or instructions about stopping the use of drugs.   High blood pressure causes heart disease and increases the risk of stroke. Your blood pressure should be checked at least every 1 to 2 years. Ongoing high blood pressure should be treated with medicines if weight loss and exercise are not effective.   If you are 55 to 26   years old, ask your caregiver if you should take aspirin to prevent strokes.   Diabetes screening involves taking a blood sample to check your fasting blood sugar level. This should be done once every 3 years, after age 45, if you are within normal weight and without risk factors for diabetes. Testing should be considered at a younger age or be carried out more frequently if you are overweight and have at least 1 risk factor for diabetes.   Breast cancer screening is essential preventive care for women. You should practice "breast  self-awareness." This means understanding the normal appearance and feel of your breasts and may include breast self-examination. Any changes detected, no matter how small, should be reported to a caregiver. Women in their 20s and 30s should have a clinical breast exam (CBE) by a caregiver as part of a regular health exam every 1 to 3 years. After age 40, women should have a CBE every year. Starting at age 40, women should consider having a mammography (breast X-ray test) every year. Women who have a family history of breast cancer should talk to their caregiver about genetic screening. Women at a high risk of breast cancer should talk to their caregivers about having magnetic resonance imaging (MRI) and a mammography every year.   The Pap test is a screening test for cervical cancer. A Pap test can show cell changes on the cervix that might become cervical cancer if left untreated. A Pap test is a procedure in which cells are obtained and examined from the lower end of the uterus (cervix).   Women should have a Pap test starting at age 21.   Between ages 21 and 29, Pap tests should be repeated every 2 years.   Beginning at age 30, you should have a Pap test every 3 years as long as the past 3 Pap tests have been normal.   Some women have medical problems that increase the chance of getting cervical cancer. Talk to your caregiver about these problems. It is especially important to talk to your caregiver if a new problem develops soon after your last Pap test. In these cases, your caregiver may recommend more frequent screening and Pap tests.   The above recommendations are the same for women who have or have not gotten the vaccine for human papillomavirus (HPV).   If you had a hysterectomy for a problem that was not cancer or a condition that could lead to cancer, then you no longer need Pap tests. Even if you no longer need a Pap test, a regular exam is a good idea to make sure no other problems are  starting.   If you are between ages 65 and 70, and you have had normal Pap tests going back 10 years, you no longer need Pap tests. Even if you no longer need a Pap test, a regular exam is a good idea to make sure no other problems are starting.   If you have had past treatment for cervical cancer or a condition that could lead to cancer, you need Pap tests and screening for cancer for at least 20 years after your treatment.   If Pap tests have been discontinued, risk factors (such as a new sexual partner) need to be reassessed to determine if screening should be resumed.   The HPV test is an additional test that may be used for cervical cancer screening. The HPV test looks for the virus that can cause the cell changes on the cervix.   The cells collected during the Pap test can be tested for HPV. The HPV test could be used to screen women aged 30 years and older, and should be used in women of any age who have unclear Pap test results. After the age of 30, women should have HPV testing at the same frequency as a Pap test.   Colorectal cancer can be detected and often prevented. Most routine colorectal cancer screening begins at the age of 50 and continues through age 75. However, your caregiver may recommend screening at an earlier age if you have risk factors for colon cancer. On a yearly basis, your caregiver may provide home test kits to check for hidden blood in the stool. Use of a small camera at the end of a tube, to directly examine the colon (sigmoidoscopy or colonoscopy), can detect the earliest forms of colorectal cancer. Talk to your caregiver about this at age 50, when routine screening begins. Direct examination of the colon should be repeated every 5 to 10 years through age 75, unless early forms of pre-cancerous polyps or small growths are found.   Hepatitis C blood testing is recommended for all people born from 1945 through 1965 and any individual with known risks for hepatitis C.    Practice safe sex. Use condoms and avoid high-risk sexual practices to reduce the spread of sexually transmitted infections (STIs). STIs include gonorrhea, chlamydia, syphilis, trichomonas, herpes, HPV, and human immunodeficiency virus (HIV). Herpes, HIV, and HPV are viral illnesses that have no cure. They can result in disability, cancer, and death. Sexually active women aged 25 and younger should be checked for chlamydia. Older women with new or multiple partners should also be tested for chlamydia. Testing for other STIs is recommended if you are sexually active and at increased risk.   Osteoporosis is a disease in which the bones lose minerals and strength with aging. This can result in serious bone fractures. The risk of osteoporosis can be identified using a bone density scan. Women ages 65 and over and women at risk for fractures or osteoporosis should discuss screening with their caregivers. Ask your caregiver whether you should take a calcium supplement or vitamin D to reduce the rate of osteoporosis.   Menopause can be associated with physical symptoms and risks. Hormone replacement therapy is available to decrease symptoms and risks. You should talk to your caregiver about whether hormone replacement therapy is right for you.   Use sunscreen with sun protection factor (SPF) of 30 or more. Apply sunscreen liberally and repeatedly throughout the day. You should seek shade when your shadow is shorter than you. Protect yourself by wearing long sleeves, pants, a wide-brimmed hat, and sunglasses year round, whenever you are outdoors.   Once a month, do a whole body skin exam, using a mirror to look at the skin on your back. Notify your caregiver of new moles, moles that have irregular borders, moles that are larger than a pencil eraser, or moles that have changed in shape or color.   Stay current with required immunizations.   Influenza. You need a dose every fall (or winter). The composition of  the flu vaccine changes each year, so being vaccinated once is not enough.   Pneumococcal polysaccharide. You need 1 to 2 doses if you smoke cigarettes or if you have certain chronic medical conditions. You need 1 dose at age 65 (or older) if you have never been vaccinated.   Tetanus, diphtheria, pertussis (Tdap, Td). Get 1 dose of   Tdap vaccine if you are younger than age 65, are over 65 and have contact with an infant, are a healthcare worker, are pregnant, or simply want to be protected from whooping cough. After that, you need a Td booster dose every 10 years. Consult your caregiver if you have not had at least 3 tetanus and diphtheria-containing shots sometime in your life or have a deep or dirty wound.   HPV. You need this vaccine if you are a woman age 26 or younger. The vaccine is given in 3 doses over 6 months.   Measles, mumps, rubella (MMR). You need at least 1 dose of MMR if you were born in 1957 or later. You may also need a second dose.   Meningococcal. If you are age 19 to 21 and a first-year college student living in a residence hall, or have one of several medical conditions, you need to get vaccinated against meningococcal disease. You may also need additional booster doses.   Zoster (shingles). If you are age 60 or older, you should get this vaccine.   Varicella (chickenpox). If you have never had chickenpox or you were vaccinated but received only 1 dose, talk to your caregiver to find out if you need this vaccine.   Hepatitis A. You need this vaccine if you have a specific risk factor for hepatitis A virus infection or you simply wish to be protected from this disease. The vaccine is usually given as 2 doses, 6 to 18 months apart.   Hepatitis B. You need this vaccine if you have a specific risk factor for hepatitis B virus infection or you simply wish to be protected from this disease. The vaccine is given in 3 doses, usually over 6 months.  Preventive Services /  Frequency Ages 19 to 39  Blood pressure check.** / Every 1 to 2 years.   Lipid and cholesterol check.** / Every 5 years beginning at age 20.   Clinical breast exam.** / Every 3 years for women in their 20s and 30s.   Pap test.** / Every 2 years from ages 21 through 29. Every 3 years starting at age 30 through age 65 or 70 with a history of 3 consecutive normal Pap tests.   HPV screening.** / Every 3 years from ages 30 through ages 65 to 70 with a history of 3 consecutive normal Pap tests.   Hepatitis C blood test.** / For any individual with known risks for hepatitis C.   Skin self-exam. / Monthly.   Influenza immunization.** / Every year.   Pneumococcal polysaccharide immunization.** / 1 to 2 doses if you smoke cigarettes or if you have certain chronic medical conditions.   Tetanus, diphtheria, pertussis (Tdap, Td) immunization. / A one-time dose of Tdap vaccine. After that, you need a Td booster dose every 10 years.   HPV immunization. / 3 doses over 6 months, if you are 26 and younger.   Measles, mumps, rubella (MMR) immunization. / You need at least 1 dose of MMR if you were born in 1957 or later. You may also need a second dose.   Meningococcal immunization. / 1 dose if you are age 19 to 21 and a first-year college student living in a residence hall, or have one of several medical conditions, you need to get vaccinated against meningococcal disease. You may also need additional booster doses.   Varicella immunization.** / Consult your caregiver.   Hepatitis A immunization.** / Consult your caregiver. 2 doses, 6 to 18 months   apart.   Hepatitis B immunization.** / Consult your caregiver. 3 doses usually over 6 months.  Ages 40 to 64  Blood pressure check.** / Every 1 to 2 years.   Lipid and cholesterol check.** / Every 5 years beginning at age 20.   Clinical breast exam.** / Every year after age 40.   Mammogram.** / Every year beginning at age 40 and continuing for as  long as you are in good health. Consult with your caregiver.   Pap test.** / Every 3 years starting at age 30 through age 65 or 70 with a history of 3 consecutive normal Pap tests.   HPV screening.** / Every 3 years from ages 30 through ages 65 to 70 with a history of 3 consecutive normal Pap tests.   Fecal occult blood test (FOBT) of stool. / Every year beginning at age 50 and continuing until age 75. You may not need to do this test if you get a colonoscopy every 10 years.   Flexible sigmoidoscopy or colonoscopy.** / Every 5 years for a flexible sigmoidoscopy or every 10 years for a colonoscopy beginning at age 50 and continuing until age 75.   Hepatitis C blood test.** / For all people born from 1945 through 1965 and any individual with known risks for hepatitis C.   Skin self-exam. / Monthly.   Influenza immunization.** / Every year.   Pneumococcal polysaccharide immunization.** / 1 to 2 doses if you smoke cigarettes or if you have certain chronic medical conditions.   Tetanus, diphtheria, pertussis (Tdap, Td) immunization.** / A one-time dose of Tdap vaccine. After that, you need a Td booster dose every 10 years.   Measles, mumps, rubella (MMR) immunization. / You need at least 1 dose of MMR if you were born in 1957 or later. You may also need a second dose.   Varicella immunization.** / Consult your caregiver.   Meningococcal immunization.** / Consult your caregiver.   Hepatitis A immunization.** / Consult your caregiver. 2 doses, 6 to 18 months apart.   Hepatitis B immunization.** / Consult your caregiver. 3 doses, usually over 6 months.  Ages 65 and over  Blood pressure check.** / Every 1 to 2 years.   Lipid and cholesterol check.** / Every 5 years beginning at age 20.   Clinical breast exam.** / Every year after age 40.   Mammogram.** / Every year beginning at age 40 and continuing for as long as you are in good health. Consult with your caregiver.   Pap test.** /  Every 3 years starting at age 30 through age 65 or 70 with a 3 consecutive normal Pap tests. Testing can be stopped between 65 and 70 with 3 consecutive normal Pap tests and no abnormal Pap or HPV tests in the past 10 years.   HPV screening.** / Every 3 years from ages 30 through ages 65 or 70 with a history of 3 consecutive normal Pap tests. Testing can be stopped between 65 and 70 with 3 consecutive normal Pap tests and no abnormal Pap or HPV tests in the past 10 years.   Fecal occult blood test (FOBT) of stool. / Every year beginning at age 50 and continuing until age 75. You may not need to do this test if you get a colonoscopy every 10 years.   Flexible sigmoidoscopy or colonoscopy.** / Every 5 years for a flexible sigmoidoscopy or every 10 years for a colonoscopy beginning at age 50 and continuing until age 75.   Hepatitis   C blood test.** / For all people born from 1945 through 1965 and any individual with known risks for hepatitis C.   Osteoporosis screening.** / A one-time screening for women ages 65 and over and women at risk for fractures or osteoporosis.   Skin self-exam. / Monthly.   Influenza immunization.** / Every year.   Pneumococcal polysaccharide immunization.** / 1 dose at age 65 (or older) if you have never been vaccinated.   Tetanus, diphtheria, pertussis (Tdap, Td) immunization. / A one-time dose of Tdap vaccine if you are over 65 and have contact with an infant, are a healthcare worker, or simply want to be protected from whooping cough. After that, you need a Td booster dose every 10 years.   Varicella immunization.** / Consult your caregiver.   Meningococcal immunization.** / Consult your caregiver.   Hepatitis A immunization.** / Consult your caregiver. 2 doses, 6 to 18 months apart.   Hepatitis B immunization.** / Check with your caregiver. 3 doses, usually over 6 months.  ** Family history and personal history of risk and conditions may change your caregiver's  recommendations. Document Released: 03/27/2001 Document Revised: 01/18/2011 Document Reviewed: 06/26/2010 [employer] Patient Information 2012 [employer]. 

## 2011-04-18 ENCOUNTER — Encounter: Payer: Self-pay | Admitting: *Deleted

## 2011-05-20 ENCOUNTER — Emergency Department (HOSPITAL_BASED_OUTPATIENT_CLINIC_OR_DEPARTMENT_OTHER)
Admission: EM | Admit: 2011-05-20 | Discharge: 2011-05-20 | Disposition: A | Discharge status: Home / Self Care | Payer: No Typology Code available for payment source | Attending: Emergency Medicine | Admitting: Emergency Medicine

## 2011-05-20 ENCOUNTER — Encounter (HOSPITAL_BASED_OUTPATIENT_CLINIC_OR_DEPARTMENT_OTHER): Payer: Self-pay | Admitting: *Deleted

## 2011-05-20 DIAGNOSIS — K029 Dental caries, unspecified: Secondary | ICD-10-CM

## 2011-05-20 DIAGNOSIS — J45909 Unspecified asthma, uncomplicated: Secondary | ICD-10-CM | POA: Insufficient documentation

## 2011-05-20 DIAGNOSIS — F17200 Nicotine dependence, unspecified, uncomplicated: Secondary | ICD-10-CM | POA: Insufficient documentation

## 2011-05-20 DIAGNOSIS — D573 Sickle-cell trait: Secondary | ICD-10-CM | POA: Insufficient documentation

## 2011-05-20 DIAGNOSIS — K0889 Other specified disorders of teeth and supporting structures: Secondary | ICD-10-CM

## 2011-05-20 DIAGNOSIS — Z91018 Allergy to other foods: Secondary | ICD-10-CM | POA: Insufficient documentation

## 2011-05-20 MED ORDER — HYDROCODONE-ACETAMINOPHEN 5-500 MG PO TABS: 1.0000 | ORAL_TABLET | Freq: Four times a day (QID) | ORAL | Status: AC | PRN

## 2011-05-20 MED ORDER — BUPIVACAINE-EPINEPHRINE PF 0.5-1:200000 % IJ SOLN
INTRAMUSCULAR | Status: AC
  Filled 2011-05-20: qty 1.8

## 2011-05-20 MED ORDER — PENICILLIN V POTASSIUM 500 MG PO TABS: 500.0000 mg | ORAL_TABLET | Freq: Four times a day (QID) | ORAL | Status: AC

## 2011-05-20 MED ORDER — FLUCONAZOLE 200 MG PO TABS: 200.0000 mg | ORAL_TABLET | Freq: Every day | ORAL | Status: AC

## 2011-05-20 NOTE — ED Provider Notes (Addendum)
History     CSN: [id number]  Arrival date & time 05/20/11  0358   First MD Initiated Contact with Patient 05/20/11 0405      Chief Complaint  Patient presents with  . Dental Pain    (Consider location/radiation/quality/duration/timing/severity/associated sxs/prior treatment) Patient is a 26 y.o. female presenting with tooth pain. The history is provided by the patient.  Dental PainThe primary symptoms include mouth pain. Primary symptoms do not include dental injury or fever. The symptoms began 6 to 12 hours ago. The symptoms are worsening. The symptoms are new. The symptoms occur constantly.  Additional symptoms include: gum tenderness and jaw pain. Additional symptoms do not include: gum swelling, facial swelling, trouble swallowing, pain with swallowing and ear pain. Medical issues do not include: alcohol problem and smoking.    Past Medical History  Diagnosis Date  . Asthma   . Seasonal allergies   . Sickle cell trait     Past Surgical History  Procedure Date  . Dilation and curettage of uterus     x's 2     Family History  Problem Relation Age of Onset  . Arthritis Mother   . Colon cancer      Maternal Second Cousin  . Arthritis Father   . Ovarian cancer Mother   . Breast cancer      Maternal second cousin  . Prostate cancer Paternal Grandfather   . Diabetes Paternal Grandfather   . Lung cancer      Maternal Great Uncle  . Hyperlipidemia Father   . Heart disease Cousin   . Heart murmur Mother   . Hypertension Mother   . Hypertension Maternal Grandmother   . Kidney disease Maternal Grandmother   . Diabetes Father   . Emotional abuse Father     Depression    History  Substance Use Topics  . Smoking status: Current Some Day Smoker    Types: Cigarettes    Last Attempt to Quit: 03/23/2011  . Smokeless tobacco: Never Used  . Alcohol Use: Yes     Occasional    OB History    Grav Para Term Preterm Abortions TAB SAB Ect Mult Living                   Review of Systems  Constitutional: Negative for fever.  HENT: Negative for ear pain, facial swelling and trouble swallowing.   All other systems reviewed and are negative.    Allergies  Chocolate and Wine  Home Medications   Current Outpatient Rx  Name Route Sig Dispense Refill  . ALBUTEROL SULFATE HFA 108 (90 BASE) MCG/ACT IN AERS Inhalation Inhale 2 puffs into the lungs every 6 (six) hours as needed. For shortness of breath 1 Inhaler 2  . ALBUTEROL SULFATE (2.5 MG/3ML) 0.083% IN NEBU Nebulization Take 3 mLs (2.5 mg total) by nebulization every 6 (six) hours as needed for wheezing. 75 mL 12  . FLUCONAZOLE 150 MG PO TABS  1 po qd x1, may repeat in 3 days prn 2 tablet 2  . FLUTICASONE-SALMETEROL 250-50 MCG/DOSE IN AEPB Inhalation Inhale 1 puff into the lungs 2 (two) times daily. 1 each 3  . MONTELUKAST SODIUM 10 MG PO TABS Oral Take 1 tablet (10 mg total) by mouth at bedtime. 90 tablet 3  . ADULT MULTIVITAMIN W/MINERALS CH Oral Take 1 tablet by mouth daily.    Marland Kitchen NAPROXEN SODIUM 220 MG PO TABS Oral Take 440 mg by mouth once as needed. For pain    .  COMPAIR NEBULIZER MISC  As directed 1 each 0    LMP 05/19/2011  Physical Exam  Constitutional: She is oriented to person, place, and time. She appears well-developed and well-nourished. No distress.  HENT:  Head: Normocephalic and atraumatic. No trismus in the jaw.  Mouth/Throat: Dental caries present. No uvula swelling.    Neck: Normal range of motion. Neck supple.  Lymphadenopathy:    She has no cervical adenopathy.  Neurological: She is alert and oriented to person, place, and time.  Skin: Skin is warm and dry.  Psychiatric: She has a normal mood and affect. Her behavior is normal.    ED Course  Dental Date/Time: 05/20/2011 4:26 AM Performed by: Gwyneth Sprout Authorized by: Gwyneth Sprout Consent: Verbal consent obtained. Consent given by: patient Local anesthesia used: yes Anesthesia: local infiltration (apical  dental block) Local anesthetic: bupivacaine 0.25% with epinephrine Anesthetic total: 2 ml Patient sedated: no Patient tolerance: Patient tolerated the procedure well with no immediate complications.   (including critical care time)  Labs Reviewed - No data to display No results found.   1. Dental decay   2. Pain, dental       MDM   Patient with multiple fillings in the mouth and evidence of dental caries. No evidence of Ludwig angina, facial swelling or fever today. Swallowing without any difficulty. Dental block performed with pain control. Patient placed on penicillin and she already has a dentist to followup with.       Gwyneth Sprout, MD 05/20/11 0425  Gwyneth Sprout, MD 05/20/11 [id number]  Gwyneth Sprout, MD 05/20/11 5409

## 2011-05-20 NOTE — ED Notes (Signed)
Patient having left sided tooth pain.

## 2011-05-20 NOTE — Discharge Instructions (Signed)
Dental Caries  Tooth decay (dental caries, cavities) is the most common of all oral diseases. It occurs in all ages but is more common in children and young adults.  CAUSES  Bacteria in your mouth combine with foods (particularly sugars and starches) to produce plaque. Plaque is a substance that sticks to the hard surfaces of teeth. The bacteria in the plaque produce acids that attack the enamel of teeth. Repeated acid attacks dissolve the enamel and create holes in the teeth. Root surfaces of teeth may also get these holes.  Other contributing factors include:   Frequent snacking and drinking of cavity-producing foods and liquids.   Poor oral hygiene.   Dry mouth.   Substance abuse such as methamphetamine.   Broken or poor fitting dental restorations.   Eating disorders.   Gastroesophageal reflux disease (GERD).   Certain radiation treatments to the head and neck.  SYMPTOMS  At first, dental decay appears as white, chalky areas on the enamel. In this early stage, symptoms are seldom present. As the decay progresses, pits and holes may appear on the enamel surfaces. Progression of the decay will lead to softening of the hard layers of the tooth. At this point you may experience some pain or achy feeling after sweet, hot, or cold foods or drinks are consumed. If left untreated, the decay will reach the internal structures of the tooth and produce severe pain. Extensive dental treatment, such as root canal therapy, may be needed to save the tooth at this late stage of decay development.  DIAGNOSIS  Most cavities will be detected during regular check-ups. A thorough medical and dental history will be taken by the dentist. The dentist will use instruments to check the surfaces of your teeth for any breakdown or discoloration. Some dentists have special instruments, such as lasers, that detect tooth decay. Dental X-rays may also show some cavities that are not visible to the eye (such as between  the contact areas of the teeth). TREATMENT  Treatment involves removal of the tooth decay and replacement with a restorative material such as silver, gold, or composite (white) material. However, if the decay involves a large area of the tooth and there is little remaining healthy tooth structure, a cap (crown) will be fitted over the remaining structure. If the decay involves the center part of the tooth (pulp), root canal treatment will be needed before any type of dental restoration is placed. If the tooth is severely destroyed by the decay process, leaving the remaining tooth structures unrestorable, the tooth will need to be pulled (extracted). Some early tooth decay may be reversed by fluoride treatments and thorough brushing and flossing at home. PREVENTION   Eat healthy foods. Restrict the amount of sugary, starchy foods and liquids you consume. Avoid frequent snacking and drinking of unhealthy foods and liquids.   Sealants can help with prevention of cavities. Sealants are composite resins applied onto the biting surfaces of teeth at risk for decay. They smooth out the pits and grooves and prevent food from being trapped in them. This is done in early childhood before tooth decay has started.   Fluoride tablets may also be prescribed to children between 6 months and 10 years of age if your drinking water is not fluoridated. The fluoride absorbed by the tooth enamel makes teeth less susceptible to decay. Thorough daily cleaning with a toothbrush and dental floss is the best way to prevent cavities. Use of a fluoride toothpaste is highly recommended. Fluoride mouth rinses   may be used in specific cases.   Topical application of fluoride by your dentist is important in children.   Regular visits with a dentist for checkups and cleanings are also important.  SEEK IMMEDIATE DENTAL CARE IF:  You have a fever.   You develop redness and swelling of your face, jaw, or neck.   You develop swelling  around a tooth.   You are unable to open your mouth or cannot swallow.   You have severe pain uncontrolled by pain medicine.  Document Released: 10/21/2001 Document Revised: 01/18/2011 Document Reviewed: 07/06/2010 [employer] Patient Information 2012 [employer]. 

## 2012-04-07 ENCOUNTER — Emergency Department (HOSPITAL_BASED_OUTPATIENT_CLINIC_OR_DEPARTMENT_OTHER)
Admission: EM | Admit: 2012-04-07 | Discharge: 2012-04-07 | Disposition: A | Discharge status: Home / Self Care | Payer: Self-pay | Attending: Emergency Medicine | Admitting: Emergency Medicine

## 2012-04-07 ENCOUNTER — Encounter (HOSPITAL_BASED_OUTPATIENT_CLINIC_OR_DEPARTMENT_OTHER): Payer: Self-pay | Admitting: *Deleted

## 2012-04-07 DIAGNOSIS — Z862 Personal history of diseases of the blood and blood-forming organs and certain disorders involving the immune mechanism: Secondary | ICD-10-CM | POA: Insufficient documentation

## 2012-04-07 DIAGNOSIS — J3489 Other specified disorders of nose and nasal sinuses: Secondary | ICD-10-CM | POA: Insufficient documentation

## 2012-04-07 DIAGNOSIS — J029 Acute pharyngitis, unspecified: Secondary | ICD-10-CM | POA: Insufficient documentation

## 2012-04-07 DIAGNOSIS — R05 Cough: Secondary | ICD-10-CM | POA: Insufficient documentation

## 2012-04-07 DIAGNOSIS — Z87891 Personal history of nicotine dependence: Secondary | ICD-10-CM | POA: Insufficient documentation

## 2012-04-07 DIAGNOSIS — R112 Nausea with vomiting, unspecified: Secondary | ICD-10-CM | POA: Insufficient documentation

## 2012-04-07 DIAGNOSIS — J45909 Unspecified asthma, uncomplicated: Secondary | ICD-10-CM | POA: Insufficient documentation

## 2012-04-07 DIAGNOSIS — R599 Enlarged lymph nodes, unspecified: Secondary | ICD-10-CM | POA: Insufficient documentation

## 2012-04-07 DIAGNOSIS — R059 Cough, unspecified: Secondary | ICD-10-CM | POA: Insufficient documentation

## 2012-04-07 DIAGNOSIS — J351 Hypertrophy of tonsils: Secondary | ICD-10-CM | POA: Insufficient documentation

## 2012-04-07 DIAGNOSIS — Z79899 Other long term (current) drug therapy: Secondary | ICD-10-CM | POA: Insufficient documentation

## 2012-04-07 DIAGNOSIS — R498 Other voice and resonance disorders: Secondary | ICD-10-CM | POA: Insufficient documentation

## 2012-04-07 LAB — RAPID STREP SCREEN (MED CTR MEBANE ONLY): Streptococcus, Group A Screen (Direct): NEGATIVE

## 2012-04-07 MED ORDER — IBUPROFEN 600 MG PO TABS: 600.0000 mg | ORAL_TABLET | Freq: Four times a day (QID) | ORAL | Status: DC | PRN

## 2012-04-07 MED ORDER — LIDOCAINE VISCOUS 2 % MT SOLN
20.0000 mL | Freq: Once | OROMUCOSAL | Status: AC
  Administered 2012-04-07: 20 mL via OROMUCOSAL
  Filled 2012-04-07: qty 30

## 2012-04-07 MED ORDER — IBUPROFEN 200 MG PO TABS
600.0000 mg | ORAL_TABLET | Freq: Once | ORAL | Status: AC
  Administered 2012-04-07: 600 mg via ORAL
  Filled 2012-04-07: qty 1

## 2012-04-07 NOTE — ED Provider Notes (Signed)
History    This chart was scribed for Monica Kaplan, MD by Donne Anon, ED Scribe. This patient was seen in room MH11/MH11 and the patient's care was started at 1510.   CSN: [id number]  Arrival date & time 04/07/12  1451   None     Chief Complaint  Patient presents with  . Sore Throat     The history is provided by the patient. No language interpreter was used.   Monica Sloan is a 27 y.o. female who presents to the Emergency Department complaining of a gradual onset, constant, gradually worsening sore throat which began 3 weeks ago. She reports associated nausea, emesis, pain with swallowing, cough, congestion, and voice changes (she had laryngitis 1.5 months ago). She denies fever, abdominal pain or any other pain. She reports she has been exposed to sick individuals. She has tried gargling with salt water and Mucinex with mild relief. She has a h/o asthma and sickle cell trait.  Past Medical History  Diagnosis Date  . Asthma   . Seasonal allergies   . Sickle cell trait     Past Surgical History  Procedure Laterality Date  . Dilation and curettage of uterus      x's 2     Family History  Problem Relation Age of Onset  . Arthritis Mother   . Colon cancer      Maternal Second Cousin  . Arthritis Father   . Ovarian cancer Mother   . Breast cancer      Maternal second cousin  . Prostate cancer Paternal Grandfather   . Diabetes Paternal Grandfather   . Lung cancer      Maternal Great Uncle  . Hyperlipidemia Father   . Heart disease Cousin   . Heart murmur Mother   . Hypertension Mother   . Hypertension Maternal Grandmother   . Kidney disease Maternal Grandmother   . Diabetes Father   . Emotional abuse Father     Depression    History  Substance Use Topics  . Smoking status: Former Smoker    Types: Cigarettes    Quit date: 02/13/2011  . Smokeless tobacco: Never Used  . Alcohol Use: Yes     Comment: Occasional    Review of Systems  Constitutional:  Negative for fever.  HENT: Positive for congestion, sore throat and voice change.   Respiratory: Positive for cough.   Gastrointestinal: Positive for nausea and vomiting. Negative for abdominal pain.  All other systems reviewed and are negative.    Allergies  Chocolate and Wine  Home Medications   Current Outpatient Rx  Name  Route  Sig  Dispense  Refill  . albuterol (PROVENTIL HFA;VENTOLIN HFA) 108 (90 BASE) MCG/ACT inhaler   Inhalation   Inhale 2 puffs into the lungs every 6 (six) hours as needed. For shortness of breath   1 Inhaler   2   . Fluticasone-Salmeterol (ADVAIR DISKUS) 250-50 MCG/DOSE AEPB   Inhalation   Inhale 1 puff into the lungs 2 (two) times daily.   1 each   3   . montelukast (SINGULAIR) 10 MG tablet   Oral   Take 1 tablet (10 mg total) by mouth at bedtime.   90 tablet   3   . Multiple Vitamin (MULITIVITAMIN WITH MINERALS) TABS   Oral   Take 1 tablet by mouth daily.         . naproxen sodium (ANAPROX) 220 MG tablet   Oral   Take 440 mg by mouth once  as needed. For pain         . Nebulizers (COMPAIR NEBULIZER) MISC      As directed   1 each   0   . EXPIRED: albuterol (PROVENTIL) (2.5 MG/3ML) 0.083% nebulizer solution   Nebulization   Take 3 mLs (2.5 mg total) by nebulization every 6 (six) hours as needed for wheezing.   75 mL   12   . fluconazole (DIFLUCAN) 150 MG tablet      1 po qd x1, may repeat in 3 days prn   2 tablet   2     BP 112/60  Pulse 70  Temp(Src) 98.7 F (37.1 C) (Oral)  Resp 20  Ht 5\' 2"  (1.575 m)  Wt 175 lb (79.379 kg)  BMI 32 kg/m2  SpO2 100%  LMP 03/31/2012  Physical Exam  Nursing note and vitals reviewed. Constitutional: She is oriented to person, place, and time. She appears well-developed and well-nourished. No distress.  HENT:  Head: Normocephalic and atraumatic.  Mouth/Throat: No oropharyngeal exudate.  Right sided tonsillar enlargement and mild erythema.  Eyes: EOM are normal.  Neck: Neck  supple. No tracheal deviation present.  Cardiovascular: Normal rate, regular rhythm and normal heart sounds.   Pulmonary/Chest: Effort normal and breath sounds normal. No respiratory distress.  Abdominal: Soft. There is no splenomegaly.  Musculoskeletal: Normal range of motion.  Lymphadenopathy:    She has cervical adenopathy.  Neurological: She is alert and oriented to person, place, and time.  Skin: Skin is warm and dry.  Psychiatric: She has a normal mood and affect. Her behavior is normal.    ED Course  Procedures (including critical care time) DIAGNOSTIC STUDIES: Oxygen Saturation is 100% on room air, normal by my interpretation.    COORDINATION OF CARE: 3:33 PM Discussed treatment plan which includes throat swab with pt at bedside and pt agreed to plan.     Labs Reviewed - No data to display No results found.   No diagnosis found.    MDM  I personally performed the services described in this documentation, which was scribed in my presence. The recorded information has been reviewed and is accurate.  Pt comes in with cc of sore thorat. Initially on the left side, and then moved to right side. Sorethorat for 3 weeks now, No malaise, + cough, no fevers, no hepatomegaly. Centor score is 2 - will get swam and cultures-  But chances of strep pharyngitis is low. Requested to see PCP if not better in another 2 weeks - as 4-5 weeks of sorethroat with tonsillar enlargement should warrant further workup or at least monitoring.         Monica Kaplan, MD 04/07/12 [phone]

## 2012-04-07 NOTE — ED Notes (Signed)
Sore throat, cough , fever x 3 weeks

## 2012-04-08 LAB — STREP A DNA PROBE

## 2012-05-16 ENCOUNTER — Encounter (HOSPITAL_BASED_OUTPATIENT_CLINIC_OR_DEPARTMENT_OTHER): Payer: Self-pay | Admitting: *Deleted

## 2012-05-16 ENCOUNTER — Emergency Department (HOSPITAL_BASED_OUTPATIENT_CLINIC_OR_DEPARTMENT_OTHER)
Admission: EM | Admit: 2012-05-16 | Discharge: 2012-05-16 | Disposition: A | Discharge status: Home / Self Care | Payer: No Typology Code available for payment source | Attending: Emergency Medicine | Admitting: Emergency Medicine

## 2012-05-16 ENCOUNTER — Emergency Department (HOSPITAL_BASED_OUTPATIENT_CLINIC_OR_DEPARTMENT_OTHER): Payer: No Typology Code available for payment source

## 2012-05-16 DIAGNOSIS — J45909 Unspecified asthma, uncomplicated: Secondary | ICD-10-CM | POA: Insufficient documentation

## 2012-05-16 DIAGNOSIS — Z862 Personal history of diseases of the blood and blood-forming organs and certain disorders involving the immune mechanism: Secondary | ICD-10-CM | POA: Insufficient documentation

## 2012-05-16 DIAGNOSIS — Z87891 Personal history of nicotine dependence: Secondary | ICD-10-CM | POA: Insufficient documentation

## 2012-05-16 DIAGNOSIS — R05 Cough: Secondary | ICD-10-CM | POA: Insufficient documentation

## 2012-05-16 DIAGNOSIS — Z79899 Other long term (current) drug therapy: Secondary | ICD-10-CM | POA: Insufficient documentation

## 2012-05-16 DIAGNOSIS — R059 Cough, unspecified: Secondary | ICD-10-CM | POA: Insufficient documentation

## 2012-05-16 MED ORDER — PREDNISONE 10 MG PO TABS: ORAL_TABLET | ORAL | Status: DC

## 2012-05-16 MED ORDER — ALBUTEROL SULFATE HFA 108 (90 BASE) MCG/ACT IN AERS
2.0000 | INHALATION_SPRAY | RESPIRATORY_TRACT | Status: DC | PRN
  Administered 2012-05-16: 2 via RESPIRATORY_TRACT

## 2012-05-16 MED ORDER — ALBUTEROL SULFATE HFA 108 (90 BASE) MCG/ACT IN AERS
INHALATION_SPRAY | RESPIRATORY_TRACT | Status: AC
  Filled 2012-05-16: qty 6.7

## 2012-05-16 NOTE — ED Notes (Signed)
Pt states she has sinusitis and has had a cough for about a week  And has started coughing up green sputum states he son has had the same pt states " Bottom line is I need an antibiotic to get over this"

## 2012-05-16 NOTE — ED Provider Notes (Signed)
History     CSN: [id number]  Arrival date & time 05/16/12  1213   First MD Initiated Contact with Patient 05/16/12 1225      Chief Complaint  Patient presents with  . Cough    (Consider location/radiation/quality/duration/timing/severity/associated sxs/prior treatment) HPI Comments: Pt states that she stopped smoking about 1 month ago  Patient is a 27 y.o. female presenting with cough. The history is provided by the patient. No language interpreter was used.  Cough Cough characteristics:  Productive Sputum characteristics:  Green Severity:  Mild Onset quality:  Gradual Timing:  Constant Chronicity:  Recurrent Smoker: no   Relieved by:  Nothing Worsened by:  Nothing tried Ineffective treatments:  Beta-agonist inhaler Associated symptoms: sinus congestion   Associated symptoms: no chest pain, no chills, no fever and no sore throat     Past Medical History  Diagnosis Date  . Asthma   . Seasonal allergies   . Sickle cell trait     Past Surgical History  Procedure Laterality Date  . Dilation and curettage of uterus      x's 2     Family History  Problem Relation Age of Onset  . Arthritis Mother   . Colon cancer      Maternal Second Cousin  . Arthritis Father   . Ovarian cancer Mother   . Breast cancer      Maternal second cousin  . Prostate cancer Paternal Grandfather   . Diabetes Paternal Grandfather   . Lung cancer      Maternal Great Uncle  . Hyperlipidemia Father   . Heart disease Cousin   . Heart murmur Mother   . Hypertension Mother   . Hypertension Maternal Grandmother   . Kidney disease Maternal Grandmother   . Diabetes Father   . Emotional abuse Father     Depression    History  Substance Use Topics  . Smoking status: Former Smoker    Types: Cigarettes    Quit date: 02/13/2011  . Smokeless tobacco: Never Used  . Alcohol Use: Yes     Comment: Occasional    OB History   Grav Para Term Preterm Abortions TAB SAB Ect Mult Living           Review of Systems  Constitutional: Negative for fever and chills.  HENT: Negative for sore throat.   Respiratory: Positive for cough.   Cardiovascular: Negative for chest pain.    Allergies  Chocolate and Wine  Home Medications   Current Outpatient Rx  Name  Route  Sig  Dispense  Refill  . albuterol (PROVENTIL HFA;VENTOLIN HFA) 108 (90 BASE) MCG/ACT inhaler   Inhalation   Inhale 2 puffs into the lungs every 6 (six) hours as needed. For shortness of breath   1 Inhaler   2   . EXPIRED: albuterol (PROVENTIL) (2.5 MG/3ML) 0.083% nebulizer solution   Nebulization   Take 3 mLs (2.5 mg total) by nebulization every 6 (six) hours as needed for wheezing.   75 mL   12   . fluconazole (DIFLUCAN) 150 MG tablet      1 po qd x1, may repeat in 3 days prn   2 tablet   2   . Fluticasone-Salmeterol (ADVAIR DISKUS) 250-50 MCG/DOSE AEPB   Inhalation   Inhale 1 puff into the lungs 2 (two) times daily.   1 each   3   . ibuprofen (ADVIL,MOTRIN) 600 MG tablet   Oral   Take 1 tablet (600 mg total) by mouth  every 6 (six) hours as needed for pain.   30 tablet   0   . montelukast (SINGULAIR) 10 MG tablet   Oral   Take 1 tablet (10 mg total) by mouth at bedtime.   90 tablet   3   . Multiple Vitamin (MULITIVITAMIN WITH MINERALS) TABS   Oral   Take 1 tablet by mouth daily.         . naproxen sodium (ANAPROX) 220 MG tablet   Oral   Take 440 mg by mouth once as needed. For pain         . Nebulizers (COMPAIR NEBULIZER) MISC      As directed   1 each   0     BP 118/67  Pulse 69  Temp(Src) 98.5 F (36.9 C) (Oral)  Resp 20  Ht 5\' 2"  (1.575 m)  Wt 174 lb (78.926 kg)  BMI 31.82 kg/m2  SpO2 100%  LMP 05/15/2012  Physical Exam  Nursing note and vitals reviewed. Constitutional: She is oriented to person, place, and time. She appears well-developed and well-nourished.  HENT:  Right Ear: External ear normal.  Left Ear: External ear normal.  Mouth/Throat:  Posterior oropharyngeal erythema present.  Eyes: Conjunctivae are normal. Pupils are equal, round, and reactive to light.  Neck: Neck supple.  Cardiovascular: Normal rate and regular rhythm.   Pulmonary/Chest: Effort normal and breath sounds normal.  Musculoskeletal: Normal range of motion.  Neurological: She is alert and oriented to person, place, and time.  Skin: Skin is warm and dry.  Psychiatric: She has a normal mood and affect.    ED Course  Procedures (including critical care time)  Labs Reviewed - No data to display Dg Chest 2 View  05/16/2012  *RADIOLOGY REPORT*  Clinical Data: Cough and fever  CHEST - 2 VIEW  Comparison: 04/05/2011  Findings: The heart and pulmonary vascularity are within normal limits.  The lungs are clear bilaterally.  No bony abnormality is noted.  IMPRESSION: No acute abnormalities seen.   Original Report Authenticated By: Alcide Clever, M.D.      1. Cough       MDM  Likely viral:pt given a new inhaler and some prednisone:no need for antibiotics at this time        Teressa Lower, NP 05/16/12 1407

## 2012-05-18 NOTE — ED Provider Notes (Signed)
Medical screening examination/treatment/procedure(s) were performed by non-physician practitioner and as supervising physician I was immediately available for consultation/collaboration.  Eluterio Seymour, MD 05/18/12 0537 

## 2012-08-21 ENCOUNTER — Emergency Department (HOSPITAL_BASED_OUTPATIENT_CLINIC_OR_DEPARTMENT_OTHER)
Admission: EM | Admit: 2012-08-21 | Discharge: 2012-08-21 | Disposition: A | Discharge status: Home / Self Care | Payer: Medicaid Other | Attending: Emergency Medicine | Admitting: Emergency Medicine

## 2012-08-21 ENCOUNTER — Emergency Department (HOSPITAL_BASED_OUTPATIENT_CLINIC_OR_DEPARTMENT_OTHER): Payer: Medicaid Other

## 2012-08-21 ENCOUNTER — Encounter (HOSPITAL_BASED_OUTPATIENT_CLINIC_OR_DEPARTMENT_OTHER): Payer: Self-pay

## 2012-08-21 DIAGNOSIS — J45909 Unspecified asthma, uncomplicated: Secondary | ICD-10-CM | POA: Insufficient documentation

## 2012-08-21 DIAGNOSIS — R0602 Shortness of breath: Secondary | ICD-10-CM | POA: Insufficient documentation

## 2012-08-21 DIAGNOSIS — J029 Acute pharyngitis, unspecified: Secondary | ICD-10-CM | POA: Insufficient documentation

## 2012-08-21 DIAGNOSIS — M542 Cervicalgia: Secondary | ICD-10-CM | POA: Insufficient documentation

## 2012-08-21 DIAGNOSIS — Z87891 Personal history of nicotine dependence: Secondary | ICD-10-CM | POA: Insufficient documentation

## 2012-08-21 DIAGNOSIS — Z862 Personal history of diseases of the blood and blood-forming organs and certain disorders involving the immune mechanism: Secondary | ICD-10-CM | POA: Insufficient documentation

## 2012-08-21 DIAGNOSIS — E042 Nontoxic multinodular goiter: Secondary | ICD-10-CM | POA: Insufficient documentation

## 2012-08-21 DIAGNOSIS — Z79899 Other long term (current) drug therapy: Secondary | ICD-10-CM | POA: Insufficient documentation

## 2012-08-21 HISTORY — DX: Disorder of thyroid, unspecified: E07.9

## 2012-08-21 NOTE — ED Notes (Addendum)
C/o swelling to anterior neck x 2 days-was seen by PCP last week- was dx with hyperthyroid and was to f/u in 4- 6 months-pt concerned with increase swelling "knot" to area-has appt with PCP at 345pm but concern with swelling prompted her to come to ED-pt NAD

## 2012-08-21 NOTE — ED Provider Notes (Addendum)
History    CSN: [id number] Arrival date & time 08/21/12  1201  First MD Initiated Contact with Patient 08/21/12 1215     Chief Complaint  Patient presents with  . Thyroid Problem   (Consider location/radiation/quality/duration/timing/severity/associated sxs/prior Treatment) HPI Comments: Patient complains of thyroid swelling. She was seen last week by her primary care provider at [facility]. At that time her primary care provider had noticed that her thyroid was swollen and therefore ordered some thyroid tests. She notes that over the last 2 days she's noticed increased swelling around the thyroid. She denies any current shortness of breath or difficulty swallowing. She has had some dry throat and increased thirst. She's had some intermittent shortness of breath but none currently. She has an appointment to followup with her primary care provider today at 345 but she said that she wasn't functioning very well work and came in here for evaluation due to the swelling of her throat.  Patient is a 27 y.o. female presenting with thyroid problem.  Thyroid Problem Associated symptoms include shortness of breath. Pertinent negatives include no chest pain, no abdominal pain and no headaches.   Past Medical History  Diagnosis Date  . Asthma   . Seasonal allergies   . Sickle cell trait   . Thyroid disease    Past Surgical History  Procedure Laterality Date  . Dilation and curettage of uterus      x's 2    Family History  Problem Relation Age of Onset  . Arthritis Mother   . Colon cancer      Maternal Second Cousin  . Arthritis Father   . Ovarian cancer Mother   . Breast cancer      Maternal second cousin  . Prostate cancer Paternal Grandfather   . Diabetes Paternal Grandfather   . Lung cancer      Maternal Great Uncle  . Hyperlipidemia Father   . Heart disease Cousin   . Heart murmur Mother   . Hypertension Mother   . Hypertension Maternal Grandmother   . Kidney  disease Maternal Grandmother   . Diabetes Father   . Emotional abuse Father     Depression   History  Substance Use Topics  . Smoking status: Former Smoker    Types: Cigarettes    Quit date: 02/13/2011  . Smokeless tobacco: Never Used  . Alcohol Use: No   OB History   Grav Para Term Preterm Abortions TAB SAB Ect Mult Living                 Review of Systems  Constitutional: Negative for fever, chills, diaphoresis and fatigue.  HENT: Positive for sore throat and neck pain. Negative for congestion, facial swelling, rhinorrhea and sneezing.   Eyes: Negative.   Respiratory: Positive for shortness of breath. Negative for cough and chest tightness.   Cardiovascular: Negative for chest pain and leg swelling.  Gastrointestinal: Negative for nausea, vomiting, abdominal pain, diarrhea and blood in stool.  Genitourinary: Negative for frequency, hematuria, flank pain and difficulty urinating.  Musculoskeletal: Negative for back pain and arthralgias.  Skin: Negative for rash.  Neurological: Negative for dizziness, speech difficulty, weakness, numbness and headaches.    Allergies  Chocolate and Wine  Home Medications   Current Outpatient Rx  Name  Route  Sig  Dispense  Refill  . albuterol (PROVENTIL HFA;VENTOLIN HFA) 108 (90 BASE) MCG/ACT inhaler   Inhalation   Inhale 2 puffs into the lungs every 6 (six) hours as  needed. For shortness of breath   1 Inhaler   2   . EXPIRED: albuterol (PROVENTIL) (2.5 MG/3ML) 0.083% nebulizer solution   Nebulization   Take 3 mLs (2.5 mg total) by nebulization every 6 (six) hours as needed for wheezing.   75 mL   12   . fluconazole (DIFLUCAN) 150 MG tablet      1 po qd x1, may repeat in 3 days prn   2 tablet   2   . Fluticasone-Salmeterol (ADVAIR DISKUS) 250-50 MCG/DOSE AEPB   Inhalation   Inhale 1 puff into the lungs 2 (two) times daily.   1 each   3   . ibuprofen (ADVIL,MOTRIN) 600 MG tablet   Oral   Take 1 tablet (600 mg total) by  mouth every 6 (six) hours as needed for pain.   30 tablet   0   . montelukast (SINGULAIR) 10 MG tablet   Oral   Take 1 tablet (10 mg total) by mouth at bedtime.   90 tablet   3   . Multiple Vitamin (MULITIVITAMIN WITH MINERALS) TABS   Oral   Take 1 tablet by mouth daily.         . naproxen sodium (ANAPROX) 220 MG tablet   Oral   Take 440 mg by mouth once as needed. For pain         . Nebulizers (COMPAIR NEBULIZER) MISC      As directed   1 each   0   . predniSONE (DELTASONE) 10 MG tablet      6 day step down dose   21 tablet   0    BP 112/57  Pulse 68  Temp(Src) 98.4 F (36.9 C) (Oral)  Resp 18  Ht 5' 1.5" (1.562 m)  Wt 178 lb (80.74 kg)  BMI 33.09 kg/m2  SpO2 100%  LMP 08/21/2012 Physical Exam  Constitutional: She is oriented to person, place, and time. She appears well-developed and well-nourished.  Patient is smiling sitting on the bed reading a book  HENT:  Head: Normocephalic and atraumatic.  Eyes: Pupils are equal, round, and reactive to light.  Neck: Normal range of motion. Neck supple. Thyromegaly present.  Patient does seem to have some bilateral enlargement of the thyroid gland with nodular irregularities.  Cardiovascular: Normal rate, regular rhythm and normal heart sounds.   Pulmonary/Chest: Effort normal and breath sounds normal. No respiratory distress. She has no wheezes. She has no rales. She exhibits no tenderness.  No stridor  Abdominal: Soft. Bowel sounds are normal. There is no tenderness. There is no rebound and no guarding.  Musculoskeletal: Normal range of motion. She exhibits no edema.  Lymphadenopathy:    She has no cervical adenopathy.  Neurological: She is alert and oriented to person, place, and time.  Skin: Skin is warm and dry. No rash noted.  Psychiatric: She has a normal mood and affect.    ED Course  Procedures (including critical care time) Results for orders placed during the hospital encounter of 04/07/12  RAPID  STREP SCREEN      Result Value Range   Streptococcus, Group A Screen (Direct) NEGATIVE  NEGATIVE  STREP A DNA PROBE      Result Value Range   Specimen Description THROAT     Special Requests NONE     Group A Strep Probe NEGATIVE     Report Status 04/08/2012 FINAL     US Soft Tissue Head/neck  08/21/2012   *RADIOLOGY REPORT*  Clinical  Data: Hypothyroidism, thyroid swelling  THYROID ULTRASOUND  Technique: Ultrasound examination of the thyroid gland and adjacent soft tissues was performed.  Comparison:  None.  Findings:  Right thyroid lobe:  Both both with 4.6 x 2.2 x 1.7 cm Left thyroid lobe:  5.4 x 1.5 x 2 cm Isthmus:  0.6 cm  Focal nodules:  Heterogeneous, predominately solid 2.9 x 2.8 x 1.9 cm nodule centered in the thyroid isthmus.  There is a second smaller 1.1 x 0.9 x 0.8 cm isoechoic nodule in the posterior inferior left thyroid lobe.  Lymphadenopathy:  None visualized.  IMPRESSION:  Dominant heterogeneous nodule the thyroid isthmus.  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  Recommend further evaluation with nuclear medicine thyroid scan. If the nodule is cold, ultrasound guided fine needle aspiration biopsy may become warranted.   Original Report Authenticated By: Malachy Moan, M.D.      1. Multiple thyroid nodules     MDM  Patient went to the thyroid nodules which is likely what the patient is feeling a spurs of swelling in her thyroid. She is new evidence of airway compromise or significant throat swelling. She has a followup appointment with her primary care physician in [facility] later today and I advised her to keep that appointment to discuss further evaluation of the thyroid nodules. I reviewed her records from [facility] from her my chart and her TSH was mildly abnormal but her T3 and T4 were normal according to the records.  I attempted to discuss with her PMD at [facility], Dr Lorenso Courier, but as of yet, she has not called me back.  Rolan Bucco, MD 08/21/12 1430  Rolan Bucco, MD 08/21/12 1444

## 2012-08-26 ENCOUNTER — Encounter (INDEPENDENT_AMBULATORY_CARE_PROVIDER_SITE_OTHER): Payer: Self-pay | Admitting: General Surgery

## 2012-08-26 ENCOUNTER — Other Ambulatory Visit (HOSPITAL_COMMUNITY)
Admission: RE | Admit: 2012-08-26 | Discharge: 2012-08-26 | Disposition: A | Discharge status: Home / Self Care | Payer: Medicaid Other | Source: Ambulatory Visit | Attending: Interventional Radiology | Admitting: Interventional Radiology

## 2012-08-26 ENCOUNTER — Ambulatory Visit
Admission: RE | Admit: 2012-08-26 | Discharge: 2012-08-26 | Disposition: A | Discharge status: Home / Self Care | Payer: Medicaid Other | Source: Ambulatory Visit | Attending: General Surgery | Admitting: General Surgery

## 2012-08-26 ENCOUNTER — Ambulatory Visit (INDEPENDENT_AMBULATORY_CARE_PROVIDER_SITE_OTHER): Payer: Medicaid Other | Admitting: General Surgery

## 2012-08-26 VITALS — BP 110/60 | HR 72 | Resp 14 | Ht 62.5 in | Wt 177.0 lb

## 2012-08-26 DIAGNOSIS — E041 Nontoxic single thyroid nodule: Secondary | ICD-10-CM

## 2012-08-26 NOTE — Progress Notes (Signed)
Subjective:     Patient ID: Monica Sloan, female   DOB: January 12, 1986, 27 y.o.   MRN: [id number]  HPI The patient is a 27 year old female who is seen recently in the ER secondary to throat swelling. The patient states that she's noted some nodules in her neck and felt this nodule was getting bigger and had some swelling.she was evaluated with an ultrasound which revealed Heterogeneous, predominately solid 2.9 x 2.8 x 1.9 cm nodule centered in the thyroid isthmus. There is a second  smaller 1.1 x 0.9 x 0.8 cm isoechoic nodule in the posterior  inferior left thyroid lobe.   Review of Systems  Constitutional: Negative.   HENT: Negative.   Respiratory: Negative.   Cardiovascular: Negative.   Gastrointestinal: Negative.   Endocrine: Positive for cold intolerance. Negative for heat intolerance.  Neurological: Negative.        Objective:   Physical Exam  Constitutional: She appears well-developed and well-nourished.  HENT:  Head: Normocephalic and atraumatic.  Eyes: Conjunctivae and EOM are normal. Pupils are equal, round, and reactive to light.  Neck: Normal range of motion. Mass (right) present.         Assessment:     27 year old female with thyroid nodules     Plan:     1. We'll have the patient undergo an ultrasound-guided biopsy of her nodules. 2. The patient does have some signs of dysphasia and will likely need a thyroidectomy versus partial thyroidectomy. After biopsy results return will have patient follow back up to discuss these as well as planned surgery.

## 2012-09-02 ENCOUNTER — Telehealth (INDEPENDENT_AMBULATORY_CARE_PROVIDER_SITE_OTHER): Payer: Self-pay | Admitting: General Surgery

## 2012-09-02 NOTE — Telephone Encounter (Signed)
Called to move patient's appt from 7/31 to 7/24 @ 11 per AR to discuss results...pt is aware and in agreement

## 2012-09-04 ENCOUNTER — Telehealth (INDEPENDENT_AMBULATORY_CARE_PROVIDER_SITE_OTHER): Payer: Self-pay | Admitting: General Surgery

## 2012-09-04 ENCOUNTER — Encounter (INDEPENDENT_AMBULATORY_CARE_PROVIDER_SITE_OTHER): Payer: Self-pay | Admitting: General Surgery

## 2012-09-04 ENCOUNTER — Ambulatory Visit (INDEPENDENT_AMBULATORY_CARE_PROVIDER_SITE_OTHER): Payer: Medicaid Other | Admitting: General Surgery

## 2012-09-04 VITALS — BP 106/58 | HR 57 | Resp 12 | Ht 61.5 in | Wt 178.8 lb

## 2012-09-04 DIAGNOSIS — E049 Nontoxic goiter, unspecified: Secondary | ICD-10-CM

## 2012-09-04 NOTE — Telephone Encounter (Signed)
LMOM letting patient know that letter is complete and ready for pick up at the front desk per her request 7/24 @ 2:54

## 2012-09-04 NOTE — Progress Notes (Signed)
Subjective:     Patient ID: Ivor Costa, female   DOB: 21-Dec-1985, 27 y.o.   MRN: [id number]  HPI The patient is a 27 year old female with a thyroid goiter.  Patient underwent ultrasound guided biopsy which revealed benign goiter. The patient states she was having symptoms of dysphagia as well as shortness of breath.   Review of Systems  Constitutional: Negative.   HENT: Positive for trouble swallowing.   Respiratory: Positive for shortness of breath.   Cardiovascular: Negative.   Gastrointestinal: Negative.   All other systems reviewed and are negative.       Objective:   Physical Exam  Constitutional: She appears well-developed and well-nourished.  HENT:  Head: Normocephalic and atraumatic.  Eyes: Conjunctivae and EOM are normal. Pupils are equal, round, and reactive to light.  Neck: Normal range of motion. Mass (right) present.        Assessment:     27 year old female with A. Symptomatic thyroid goiter.    Plan:     1. Patient elected to be referred to an endocrinologist to discuss more about the medical treatment and the postoperative hormone supplementation should she decide for thyroidectomy.  2. Patient can call us at any time after making a decision to get scheduled for total thyroidectomy

## 2012-09-04 NOTE — Addendum Note (Signed)
Addended by: June Leap on: 09/04/2012 12:11 PM   Modules accepted: Orders

## 2012-09-04 NOTE — Telephone Encounter (Signed)
Spoke with pt and informed her that she has an appt w/ Dr. Lucianne Muss w/ [facility] on 09/12/12 at 10:30.  The address and phone number were given.  She also would like a return to work note.  She explained that her boss has kept her out of work until her appt today and she would like a letter saying that it is okay for her to go back to work until a surgery date has been officially decided. I informed her that I would pass this message to his nurse.  She explained that she would like to pick it up from our front desk by this afternoon's closing time.

## 2012-09-11 ENCOUNTER — Encounter (INDEPENDENT_AMBULATORY_CARE_PROVIDER_SITE_OTHER): Payer: Medicaid Other | Admitting: General Surgery

## 2012-09-12 ENCOUNTER — Ambulatory Visit: Payer: Medicaid Other | Admitting: Endocrinology

## 2012-09-19 ENCOUNTER — Ambulatory Visit (INDEPENDENT_AMBULATORY_CARE_PROVIDER_SITE_OTHER): Payer: Medicaid Other | Admitting: General Surgery

## 2012-09-19 ENCOUNTER — Encounter (INDEPENDENT_AMBULATORY_CARE_PROVIDER_SITE_OTHER): Payer: Self-pay | Admitting: General Surgery

## 2012-09-19 VITALS — BP 100/68 | HR 70 | Resp 18 | Ht 61.5 in | Wt 180.0 lb

## 2012-09-19 DIAGNOSIS — E049 Nontoxic goiter, unspecified: Secondary | ICD-10-CM

## 2012-09-19 NOTE — Progress Notes (Signed)
Patient ID: Monica Sloan, female   DOB: [birth year], 27 y.o.   MRN: [id number] The patient is a 27 year old female with a thyroid quarter. Patient had a appointment with Dr. Christiana Pellant secondary to work. I discussed with the patient today she would like to consider potential medical treatments for her thyroid in order. The patient referred back Dr. Lucianne Muss did have a non-surgical options. The patient this time also with surgery.  The patient follow up as needed

## 2012-10-01 ENCOUNTER — Ambulatory Visit (INDEPENDENT_AMBULATORY_CARE_PROVIDER_SITE_OTHER): Payer: Medicaid Other | Admitting: Endocrinology

## 2012-10-01 ENCOUNTER — Encounter: Payer: Self-pay | Admitting: Endocrinology

## 2012-10-01 VITALS — BP 114/70 | HR 53 | Temp 98.6°F | Resp 12 | Ht 61.5 in | Wt 179.8 lb

## 2012-10-01 DIAGNOSIS — E042 Nontoxic multinodular goiter: Secondary | ICD-10-CM

## 2012-10-01 NOTE — Patient Instructions (Addendum)
To have nuclear thyroid scan

## 2012-10-01 NOTE — Progress Notes (Signed)
Patient ID: Monica Sloan, female   DOB: [birth year], 27 y.o.   MRN: [id number]  Reason for Appointment:  Thyroid enlargement, new visit    History of Present Illness:   The thyroid enlargement was first diagnosed in ? 7/14  Over the last few months patient has had occasional symptoms of discomfort in her lower neck sometimes radiating to the side of the neck. This may occasionally occur while swallowing or talking. Symptoms are somewhat worse when she has stress She does not have any difficulty swallowing food but the left times feel a knot in the neck. Over the last month or so she has gained a symptoms of feeling choked when lying down relieved by sitting up but these are infrequent and does not wake her up regularly She had a thyroid enlargement discovered by her PCP when she went for a routine visit earlier this year  She had a thyroid ultrasound in 7/14 which showed the following: Heterogeneous, predominately solid 2.9 x 2.8 x 1.9 cm nodule centered in the thyroid isthmus.  There is a second smaller 1.1 x 0.9 x 0.8 cm isoechoic nodule in the posterior inferior left thyroid lobe. Needle Aspiration of this was done and results were compatible with non-neoplastic goiter  She was seen by a surgeon who has referred her here for further management  Her labs on 08/13/12 showed TSH of 0.374, free T4 normal at 1.17 and a total T3 normal at 118  Past Medical History  Diagnosis Date  . Asthma   . Seasonal allergies   . Sickle cell trait   . Thyroid disease     Past Surgical History  Procedure Laterality Date  . Dilation and curettage of uterus  2009    x's 2     Family History  Problem Relation Age of Onset  . Arthritis Mother   . Colon cancer      Maternal Second Cousin  . Arthritis Father   . Ovarian cancer Mother   . Breast cancer      Maternal second cousin  . Prostate cancer Paternal Grandfather   . Diabetes Paternal Grandfather   . Lung cancer      Maternal Great  Uncle  . Hyperlipidemia Father   . Heart disease Cousin   . Heart murmur Mother   . Hypertension Mother   . Hypertension Maternal Grandmother   . Kidney disease Maternal Grandmother   . Diabetes Father   . Emotional abuse Father     Depression  gm  Social History:  reports that she quit smoking about 6 months ago. Her smoking use included Cigarettes. She smoked 0.00 packs per day. She has never used smokeless tobacco. She reports that she does not drink alcohol or use illicit drugs.  Allergies:  Allergies  Allergen Reactions  . Chocolate Swelling  . Peanuts [Peanut Oil]   . Wine [Alcohol] Swelling      Medication List       This list is accurate as of: 10/01/12  8:46 AM.  Always use your most recent med list.               albuterol 108 (90 BASE) MCG/ACT inhaler  Commonly known as:  PROVENTIL HFA;VENTOLIN HFA  Inhale 2 puffs into the lungs every 6 (six) hours as needed. For shortness of breath     diclofenac 75 MG EC tablet  Commonly known as:  VOLTAREN     montelukast 10 MG tablet  Commonly known as:  SINGULAIR  Take 1 tablet (10 mg total) by mouth at bedtime.        Review of Systems:  CARDIOLOGY: no history of high blood pressure.  Rarely she may feel her heart beating fast for about 4-5 beats, has had only occasional episodes    ENDOCRINOLOGY:  no history of Diabetes, believes she has had prediabetes, recent glucose 85 and A1c 5.5.     She has gained about 30 pounds in the last 6 months, she is trying to exercise and eat healthy Hands get puffy along with ankles Her menstrual cycles are regular although recently lasting one day less than usual The history of shortness of breath but she has mild asthma taking inhalers rarely She complains of low energy level. Also gets hot and cold at times She has had chronic constipation   Examination:    BP 114/70  Pulse 53  Temp(Src) 98.6 F (37 C)  Resp 12  Ht 5' 1.5" (1.562 m)  Wt 179 lb 12.8 oz (81.557 kg)   BMI 33.43 kg/m2  SpO2 98%  LMP 09/12/2012   General Appearance: pleasant, well-looking and no cushingoid features of her face or trunk          Eyes: No proptosis or eyelid swelling .          Neck: The thyroid exam shows a 5 cm oval nodule on the right side extending to the isthmus. This feels smooth and slightly firm without tenderness. Left side is just palpable and smooth. Pemberton sign negative. No stridor heard  There is no lymphadenopathy .    Cardiovascular: Normal apex and heart sounds, no murmur Respiratory:  Lungs clear Neurological: REFLEXES: at biceps are normal. Extremities: No edema and no puffiness of her fingers    Skin: Normal, no abnormal pigmentation or acanthosis      Assessments 1. Multinodular goiter with dominant benign nodule. She does have some local pressure symptoms but these are tolerable.  Treatment:    Since her TSH is low normal she is not a candidate for thyroid suppression.  This was explained to her  However would like to do a thyroid scan to rule out a warm or hot nodule that could be potentially treated with I-131. She is not currently wanting to consider surgery unless she has very significant symptoms. She agrees to this    [facility] 10/01/2012, 8:46 AM

## 2012-10-09 ENCOUNTER — Encounter (HOSPITAL_COMMUNITY)
Admission: RE | Admit: 2012-10-09 | Discharge: 2012-10-09 | Disposition: A | Discharge status: Home / Self Care | Payer: Medicaid Other | Source: Ambulatory Visit | Attending: Endocrinology | Admitting: Endocrinology

## 2012-10-09 DIAGNOSIS — E042 Nontoxic multinodular goiter: Secondary | ICD-10-CM | POA: Insufficient documentation

## 2012-10-10 ENCOUNTER — Encounter (HOSPITAL_COMMUNITY): Payer: Self-pay

## 2012-10-10 ENCOUNTER — Encounter (HOSPITAL_COMMUNITY)
Admission: RE | Admit: 2012-10-10 | Discharge: 2012-10-10 | Disposition: A | Discharge status: Home / Self Care | Payer: Medicaid Other | Source: Ambulatory Visit | Attending: Endocrinology | Admitting: Endocrinology

## 2012-10-10 LAB — PREGNANCY, URINE: Preg Test, Ur: NEGATIVE

## 2012-10-10 MED ORDER — SODIUM PERTECHNETATE TC 99M INJECTION
10.0000 | Freq: Once | INTRAVENOUS | Status: AC | PRN
  Administered 2012-10-10: 10 via INTRAVENOUS

## 2012-10-10 MED ORDER — SODIUM IODIDE I 131 CAPSULE
8.9000 | Freq: Once | INTRAVENOUS | Status: AC | PRN
  Administered 2012-10-10: 8.9 via ORAL

## 2012-11-10 ENCOUNTER — Encounter (HOSPITAL_BASED_OUTPATIENT_CLINIC_OR_DEPARTMENT_OTHER): Payer: Self-pay | Admitting: *Deleted

## 2012-11-10 ENCOUNTER — Emergency Department (HOSPITAL_BASED_OUTPATIENT_CLINIC_OR_DEPARTMENT_OTHER)
Admission: EM | Admit: 2012-11-10 | Discharge: 2012-11-10 | Disposition: A | Discharge status: Home / Self Care | Payer: Medicaid Other | Attending: Emergency Medicine | Admitting: Emergency Medicine

## 2012-11-10 ENCOUNTER — Emergency Department (HOSPITAL_BASED_OUTPATIENT_CLINIC_OR_DEPARTMENT_OTHER): Payer: Medicaid Other

## 2012-11-10 DIAGNOSIS — O98819 Other maternal infectious and parasitic diseases complicating pregnancy, unspecified trimester: Secondary | ICD-10-CM | POA: Insufficient documentation

## 2012-11-10 DIAGNOSIS — Z862 Personal history of diseases of the blood and blood-forming organs and certain disorders involving the immune mechanism: Secondary | ICD-10-CM | POA: Insufficient documentation

## 2012-11-10 DIAGNOSIS — B9689 Other specified bacterial agents as the cause of diseases classified elsewhere: Secondary | ICD-10-CM

## 2012-11-10 DIAGNOSIS — O26899 Other specified pregnancy related conditions, unspecified trimester: Secondary | ICD-10-CM

## 2012-11-10 DIAGNOSIS — Z8639 Personal history of other endocrine, nutritional and metabolic disease: Secondary | ICD-10-CM | POA: Insufficient documentation

## 2012-11-10 DIAGNOSIS — J45909 Unspecified asthma, uncomplicated: Secondary | ICD-10-CM | POA: Insufficient documentation

## 2012-11-10 DIAGNOSIS — Z87891 Personal history of nicotine dependence: Secondary | ICD-10-CM | POA: Insufficient documentation

## 2012-11-10 DIAGNOSIS — O9989 Other specified diseases and conditions complicating pregnancy, childbirth and the puerperium: Secondary | ICD-10-CM | POA: Insufficient documentation

## 2012-11-10 DIAGNOSIS — O2390 Unspecified genitourinary tract infection in pregnancy, unspecified trimester: Secondary | ICD-10-CM | POA: Insufficient documentation

## 2012-11-10 LAB — URINALYSIS, ROUTINE W REFLEX MICROSCOPIC
Bilirubin Urine: NEGATIVE
Hgb urine dipstick: NEGATIVE
Ketones, ur: NEGATIVE mg/dL
Nitrite: NEGATIVE
Urobilinogen, UA: 0.2 mg/dL (ref 0.0–1.0)
pH: 5.5 (ref 5.0–8.0)

## 2012-11-10 LAB — CBC WITH DIFFERENTIAL/PLATELET
Basophils Absolute: 0 10*3/uL (ref 0.0–0.1)
Basophils Relative: 0 % (ref 0–1)
Eosinophils Absolute: 0.1 10*3/uL (ref 0.0–0.7)
Eosinophils Relative: 2 % (ref 0–5)
Lymphs Abs: 2.1 10*3/uL (ref 0.7–4.0)
MCH: 30.3 pg (ref 26.0–34.0)
MCV: 87.8 fL (ref 78.0–100.0)
Neutrophils Relative %: 61 % (ref 43–77)
Platelets: 296 10*3/uL (ref 150–400)
RBC: 3.93 MIL/uL (ref 3.87–5.11)
RDW: 12.9 % (ref 11.5–15.5)

## 2012-11-10 LAB — BASIC METABOLIC PANEL
Calcium: 9.3 mg/dL (ref 8.4–10.5)
GFR calc Af Amer: >90 mL/min (ref >90)
GFR calc non Af Amer: 87 mL/min — ABNORMAL LOW (ref >90)
Potassium: 3.5 mEq/L (ref 3.5–5.1)
Sodium: 137 mEq/L (ref 135–145)

## 2012-11-10 LAB — WET PREP, GENITAL
Trich, Wet Prep: NONE SEEN
Yeast Wet Prep HPF POC: NONE SEEN

## 2012-11-10 LAB — HCG, QUANTITATIVE, PREGNANCY: hCG, Beta Chain, Quant, S: 2806 m[IU]/mL — ABNORMAL HIGH (ref <5)

## 2012-11-10 LAB — URINE MICROSCOPIC-ADD ON

## 2012-11-10 MED ORDER — METRONIDAZOLE 500 MG PO TABS: 500.0000 mg | ORAL_TABLET | Freq: Two times a day (BID) | ORAL | Status: DC

## 2012-11-10 NOTE — ED Notes (Signed)
She is [redacted] weeks pregnant. C.o abdominal cramping. No vaginal bleeding. She was given a referral to an OB-GYN by her MD this am.

## 2012-11-10 NOTE — ED Provider Notes (Signed)
CSN: [id number]     Arrival date & time 11/10/12  1255 History   First MD Initiated Contact with Patient 11/10/12 1315     Chief Complaint  Patient presents with  . Abdominal Cramping   (Consider location/radiation/quality/duration/timing/severity/associated sxs/prior Treatment) HPI Comments: Pt states that she is [redacted] weeks pregnant:last lmp was 8/19:denies vaginal bleeding:states that she is having generalized lower abdominal cramping:no vomiting, dysuria, fever or discharge:pt states that she has one live child and has had 2 spontaneous miscarriages:pt doesn't have a local ob  The history is provided by the patient. No language interpreter was used.    Past Medical History  Diagnosis Date  . Asthma   . Seasonal allergies   . Sickle cell trait   . Thyroid disease    Past Surgical History  Procedure Laterality Date  . Dilation and curettage of uterus  2009    x's 2    Family History  Problem Relation Age of Onset  . Arthritis Mother   . Colon cancer      Maternal Second Cousin  . Arthritis Father   . Ovarian cancer Mother   . Breast cancer      Maternal second cousin  . Prostate cancer Paternal Grandfather   . Diabetes Paternal Grandfather   . Lung cancer      Maternal Great Uncle  . Hyperlipidemia Father   . Heart disease Cousin   . Heart murmur Mother   . Hypertension Mother   . Hypertension Maternal Grandmother   . Kidney disease Maternal Grandmother   . Diabetes Father   . Emotional abuse Father     Depression   History  Substance Use Topics  . Smoking status: Former Smoker    Types: Cigarettes    Quit date: 03/15/2012  . Smokeless tobacco: Never Used  . Alcohol Use: No   OB History   Grav Para Term Preterm Abortions TAB SAB Ect Mult Living   1              Review of Systems  Constitutional: Negative.   Respiratory: Negative.   Cardiovascular: Negative.     Allergies  Chocolate; Peanuts; and Wine  Home Medications   Current Outpatient Rx   Name  Route  Sig  Dispense  Refill  . albuterol (PROVENTIL HFA;VENTOLIN HFA) 108 (90 BASE) MCG/ACT inhaler   Inhalation   Inhale 2 puffs into the lungs every 6 (six) hours as needed. For shortness of breath   1 Inhaler   2   . diclofenac (VOLTAREN) 75 MG EC tablet               . montelukast (SINGULAIR) 10 MG tablet   Oral   Take 1 tablet (10 mg total) by mouth at bedtime.   90 tablet   3    BP 108/67  Pulse 72  Temp(Src) 99.1 F (37.3 C) (Oral)  Resp 18  Ht 5' 1.5" (1.562 m)  Wt 182 lb (82.555 kg)  BMI 33.84 kg/m2  SpO2 100%  LMP 09/30/2012 Physical Exam  Vitals reviewed. Constitutional: She is oriented to person, place, and time. She appears well-developed and well-nourished.  HENT:  Head: Normocephalic and atraumatic.  Eyes: Conjunctivae and EOM are normal.  Neck: Normal range of motion. Neck supple.  Cardiovascular: Normal rate and regular rhythm.   Pulmonary/Chest: Effort normal and breath sounds normal.  Abdominal: Soft. Bowel sounds are normal. There is no tenderness.  Genitourinary:  No vaginal discharge  Musculoskeletal: Normal range of  motion.  Neurological: She is oriented to person, place, and time.  Skin: Skin is warm and dry.  Psychiatric: She has a normal mood and affect.    ED Course  Procedures (including critical care time) Labs Review Labs Reviewed  WET PREP, GENITAL - Abnormal; Notable for the following:    Clue Cells Wet Prep HPF POC MODERATE (*)    WBC, Wet Prep HPF POC TOO NUMEROUS TO COUNT (*)    All other components within normal limits  URINALYSIS, ROUTINE W REFLEX MICROSCOPIC - Abnormal; Notable for the following:    Leukocytes, UA TRACE (*)    All other components within normal limits  PREGNANCY, URINE - Abnormal; Notable for the following:    Preg Test, Ur POSITIVE (*)    All other components within normal limits  CBC WITH DIFFERENTIAL - Abnormal; Notable for the following:    Hemoglobin 11.9 (*)    HCT 34.5 (*)    All  other components within normal limits  BASIC METABOLIC PANEL - Abnormal; Notable for the following:    GFR calc non Af Amer 87 (*)    All other components within normal limits  HCG, QUANTITATIVE, PREGNANCY - Abnormal; Notable for the following:    hCG, Beta Chain, Quant, S 2806 (*)    All other components within normal limits  GC/CHLAMYDIA PROBE AMP  URINE CULTURE  URINE MICROSCOPIC-ADD ON  RPR   Imaging Review US Ob Comp Less 14 Wks  11/10/2012   CLINICAL DATA:  Pelvic pain and cramping. Estimated gestational age by last menstrual period equals 5 weeks 6 days.  EXAM: TRANSVAGINAL OB ULTRASOUND; OBSTETRIC <14 WK ULTRASOUND  TECHNIQUE: Transvaginal ultrasound was performed for complete evaluation of the gestation as well as the maternal uterus, adnexal regions, and pelvic cul-de-sac.  COMPARISON:  None.  FINDINGS: Intrauterine gestational sac: Single  Yolk sac:  Not identified  Embryo:  Not identified  MSD: 6.1  mm   5 w   2 d [place] EDC: 07/11/2013  Maternal uterus/adnexae: Normal the right ovary. Left ovary not visualized. Trace free fluid. No subchorionic hemorrhage.  IMPRESSION: Probable early intrauterine gestational sac, but no yolk sac, fetal pole, or cardiac activity yet visualized. Recommend follow-up quantitative B-HCG levels and follow-up US in 14 days to confirm and assess viability. This recommendation follows SRU consensus guidelines: Diagnostic Criteria for Nonviable Pregnancy Early in the First Trimester. Malva Limes Med 2013; 161:0960-45.   Electronically Signed   By: Genevive Bi M.D.   On: 11/10/2012 14:34   US Ob Transvaginal  11/10/2012   CLINICAL DATA:  Pelvic pain and cramping. Estimated gestational age by last menstrual period equals 5 weeks 6 days.  EXAM: TRANSVAGINAL OB ULTRASOUND; OBSTETRIC <14 WK ULTRASOUND  TECHNIQUE: Transvaginal ultrasound was performed for complete evaluation of the gestation as well as the maternal uterus, adnexal regions, and pelvic cul-de-sac.   COMPARISON:  None.  FINDINGS: Intrauterine gestational sac: Single  Yolk sac:  Not identified  Embryo:  Not identified  MSD: 6.1  mm   5 w   2 d [place] EDC: 07/11/2013  Maternal uterus/adnexae: Normal the right ovary. Left ovary not visualized. Trace free fluid. No subchorionic hemorrhage.  IMPRESSION: Probable early intrauterine gestational sac, but no yolk sac, fetal pole, or cardiac activity yet visualized. Recommend follow-up quantitative B-HCG levels and follow-up US in 14 days to confirm and assess viability. This recommendation follows SRU consensus guidelines: Diagnostic Criteria for Nonviable Pregnancy Early in the First Trimester. N Engl  J Med 2013; 161:0960-45.   Electronically Signed   By: Genevive Bi M.D.   On: 11/10/2012 14:34    MDM   1. Abdominal pain in pregnancy   2. BV (bacterial vaginosis)    Ultrasound consistent with where she should be at this time:pt is not bleeding:discussed with pt that unable to tell if she is going to miscarriage:pt is okay to follow up ob when pcp makes appointment    Teressa Lower, NP 11/10/12 1635

## 2012-11-11 LAB — GC/CHLAMYDIA PROBE AMP
CT Probe RNA: NEGATIVE
GC Probe RNA: NEGATIVE

## 2012-11-11 LAB — URINE CULTURE: Culture: NO GROWTH

## 2012-11-11 NOTE — ED Provider Notes (Signed)
Medical screening examination/treatment/procedure(s) were performed by non-physician practitioner and as supervising physician I was immediately available for consultation/collaboration.  Amman Bartel T Queen Abbett, MD 11/11/12 1518 

## 2012-11-28 ENCOUNTER — Encounter: Payer: Self-pay | Admitting: *Deleted

## 2012-12-16 ENCOUNTER — Telehealth: Payer: Self-pay | Admitting: Endocrinology

## 2012-12-16 NOTE — Telephone Encounter (Signed)
12/16/2012 10:29 AM Phone (Incoming) Monica Sloan, Monica Sloan (Self) [phone] (H) Pt wants to let you know that she is pregnant she is doing fine but wants you to know this and if she needs to be seen please call if she needs to come in.

## 2012-12-16 NOTE — Telephone Encounter (Signed)
No need to be seen during pregnancy for her thyroid

## 2012-12-17 ENCOUNTER — Encounter: Payer: Medicaid Other | Admitting: Family Medicine

## 2012-12-18 ENCOUNTER — Other Ambulatory Visit: Payer: Self-pay

## 2012-12-19 LAB — OB RESULTS CONSOLE GC/CHLAMYDIA
Chlamydia: NEGATIVE
GC PROBE AMP, GENITAL: NEGATIVE

## 2012-12-19 LAB — OB RESULTS CONSOLE ABO/RH: RH Type: POSITIVE

## 2012-12-19 LAB — OB RESULTS CONSOLE RUBELLA ANTIBODY, IGM: Rubella: IMMUNE

## 2012-12-19 LAB — OB RESULTS CONSOLE ANTIBODY SCREEN: ANTIBODY SCREEN: NEGATIVE

## 2012-12-19 LAB — OB RESULTS CONSOLE HIV ANTIBODY (ROUTINE TESTING): HIV: NONREACTIVE

## 2012-12-19 LAB — OB RESULTS CONSOLE HEPATITIS B SURFACE ANTIGEN: Hepatitis B Surface Ag: NEGATIVE

## 2013-01-28 ENCOUNTER — Other Ambulatory Visit: Payer: Self-pay | Admitting: *Deleted

## 2013-01-28 ENCOUNTER — Other Ambulatory Visit: Payer: Medicaid Other

## 2013-01-28 DIAGNOSIS — E042 Nontoxic multinodular goiter: Secondary | ICD-10-CM

## 2013-02-02 ENCOUNTER — Ambulatory Visit: Payer: Medicaid Other | Admitting: Endocrinology

## 2013-05-04 ENCOUNTER — Telehealth: Payer: Self-pay | Admitting: *Deleted

## 2013-05-04 NOTE — Telephone Encounter (Signed)
she is 7 months pregnant, TSH level is higher than normal, if she needs to schedule an appt because of this let her know,   [phone] (H),

## 2013-05-04 NOTE — Telephone Encounter (Signed)
Would like to see her, please make sure her TSH report is within the last 2 weeks

## 2013-05-04 NOTE — Telephone Encounter (Signed)
Message left on patients vm 

## 2013-06-30 LAB — OB RESULTS CONSOLE GBS: STREP GROUP B AG: NEGATIVE

## 2013-07-01 ENCOUNTER — Encounter (HOSPITAL_COMMUNITY): Payer: Self-pay

## 2013-07-01 ENCOUNTER — Inpatient Hospital Stay (HOSPITAL_COMMUNITY): Payer: Medicaid Other

## 2013-07-01 ENCOUNTER — Inpatient Hospital Stay (HOSPITAL_COMMUNITY)
Admission: AD | Admit: 2013-07-01 | Discharge: 2013-07-02 | Disposition: A | Discharge status: Home / Self Care | Payer: Medicaid Other | Source: Ambulatory Visit | Attending: Obstetrics and Gynecology | Admitting: Obstetrics and Gynecology

## 2013-07-01 DIAGNOSIS — O368390 Maternal care for abnormalities of the fetal heart rate or rhythm, unspecified trimester, not applicable or unspecified: Secondary | ICD-10-CM | POA: Insufficient documentation

## 2013-07-01 DIAGNOSIS — O479 False labor, unspecified: Secondary | ICD-10-CM | POA: Insufficient documentation

## 2013-07-01 NOTE — MAU Note (Signed)
Contractions, denies problems with pregnancy. Denies bleeding or ROM

## 2013-07-01 NOTE — Progress Notes (Signed)
Notified of pt arrival to MAU, cervical check and FHR tracing with variable. Received orders to discharge pt once tracing is reactive.

## 2013-07-01 NOTE — Discharge Instructions (Signed)

## 2013-07-01 NOTE — Progress Notes (Signed)
Called to notify of non reactive FHR tracing. Received orders for BPP

## 2013-07-02 DIAGNOSIS — O368390 Maternal care for abnormalities of the fetal heart rate or rhythm, unspecified trimester, not applicable or unspecified: Secondary | ICD-10-CM | POA: Diagnosis not present

## 2013-07-05 ENCOUNTER — Encounter (HOSPITAL_COMMUNITY): Payer: Self-pay | Admitting: *Deleted

## 2013-07-05 ENCOUNTER — Inpatient Hospital Stay (HOSPITAL_COMMUNITY)
Admission: AD | Admit: 2013-07-05 | Discharge: 2013-07-06 | Disposition: A | Discharge status: Home / Self Care | Payer: Medicaid Other | Source: Ambulatory Visit | Attending: Obstetrics and Gynecology | Admitting: Obstetrics and Gynecology

## 2013-07-05 DIAGNOSIS — O479 False labor, unspecified: Secondary | ICD-10-CM | POA: Insufficient documentation

## 2013-07-06 DIAGNOSIS — O479 False labor, unspecified: Secondary | ICD-10-CM | POA: Diagnosis not present

## 2013-07-06 LAB — POCT FERN TEST: POCT Fern Test: NEGATIVE

## 2013-07-06 NOTE — Discharge Instructions (Signed)

## 2013-07-07 ENCOUNTER — Encounter (HOSPITAL_COMMUNITY): Payer: Self-pay | Admitting: *Deleted

## 2013-07-07 ENCOUNTER — Inpatient Hospital Stay (HOSPITAL_COMMUNITY)
Admission: AD | Admit: 2013-07-07 | Discharge: 2013-07-09 | DRG: 775 | Disposition: A | Discharge status: Home / Self Care | Payer: Medicaid Other | Source: Ambulatory Visit | Attending: Obstetrics and Gynecology | Admitting: Obstetrics and Gynecology

## 2013-07-07 ENCOUNTER — Inpatient Hospital Stay (HOSPITAL_COMMUNITY): Payer: Medicaid Other

## 2013-07-07 ENCOUNTER — Inpatient Hospital Stay (HOSPITAL_COMMUNITY): Payer: Medicaid Other | Admitting: Anesthesiology

## 2013-07-07 ENCOUNTER — Encounter (HOSPITAL_COMMUNITY): Payer: Medicaid Other | Admitting: Anesthesiology

## 2013-07-07 DIAGNOSIS — E042 Nontoxic multinodular goiter: Secondary | ICD-10-CM

## 2013-07-07 DIAGNOSIS — Z87891 Personal history of nicotine dependence: Secondary | ICD-10-CM

## 2013-07-07 DIAGNOSIS — Z833 Family history of diabetes mellitus: Secondary | ICD-10-CM

## 2013-07-07 LAB — TYPE AND SCREEN
ABO/RH(D): A POS
Antibody Screen: NEGATIVE

## 2013-07-07 LAB — RPR

## 2013-07-07 LAB — CBC
HCT: 34.9 % — ABNORMAL LOW (ref 36.0–46.0)
Hemoglobin: 12.3 g/dL (ref 12.0–15.0)
MCH: 32.8 pg (ref 26.0–34.0)
MCHC: 35.2 g/dL (ref 30.0–36.0)
MCV: 93.1 fL (ref 78.0–100.0)
PLATELETS: 300 10*3/uL (ref 150–400)
RBC: 3.75 MIL/uL — ABNORMAL LOW (ref 3.87–5.11)
RDW: 13.2 % (ref 11.5–15.5)
WBC: 8.4 10*3/uL (ref 4.0–10.5)

## 2013-07-07 LAB — ABO/RH: ABO/RH(D): A POS

## 2013-07-07 MED ORDER — ACETAMINOPHEN 325 MG PO TABS: 650.0000 mg | ORAL_TABLET | ORAL | Status: DC | PRN

## 2013-07-07 MED ORDER — WITCH HAZEL-GLYCERIN EX PADS: 1.0000 "application " | MEDICATED_PAD | CUTANEOUS | Status: DC | PRN

## 2013-07-07 MED ORDER — LACTATED RINGERS IV SOLN
500.0000 mL | Freq: Once | INTRAVENOUS | Status: AC
  Administered 2013-07-07: 1000 mL via INTRAVENOUS

## 2013-07-07 MED ORDER — PHENYLEPHRINE 40 MCG/ML (10ML) SYRINGE FOR IV PUSH (FOR BLOOD PRESSURE SUPPORT)
80.0000 ug | PREFILLED_SYRINGE | INTRAVENOUS | Status: DC | PRN
Start: 2013-07-07 — End: 2013-07-07
  Filled 2013-07-07: qty 10

## 2013-07-07 MED ORDER — OXYTOCIN 40 UNITS IN LACTATED RINGERS INFUSION - SIMPLE MED: 62.5000 mL/h | INTRAVENOUS | Status: AC | PRN

## 2013-07-07 MED ORDER — ZOLPIDEM TARTRATE 5 MG PO TABS: 5.0000 mg | ORAL_TABLET | Freq: Every evening | ORAL | Status: DC | PRN

## 2013-07-07 MED ORDER — ONDANSETRON HCL 4 MG/2ML IJ SOLN: 4.0000 mg | Freq: Four times a day (QID) | INTRAMUSCULAR | Status: DC | PRN

## 2013-07-07 MED ORDER — MAGNESIUM HYDROXIDE 400 MG/5ML PO SUSP: 2.0000 mL | Freq: Every day | ORAL | Status: DC | PRN

## 2013-07-07 MED ORDER — LACTATED RINGERS IV SOLN: 500.0000 mL | INTRAVENOUS | Status: DC | PRN

## 2013-07-07 MED ORDER — FENTANYL 2.5 MCG/ML BUPIVACAINE 1/10 % EPIDURAL INFUSION (WH - ANES)
14.0000 mL/h | INTRAMUSCULAR | Status: DC | PRN
  Filled 2013-07-07: qty 125

## 2013-07-07 MED ORDER — PHENYLEPHRINE 40 MCG/ML (10ML) SYRINGE FOR IV PUSH (FOR BLOOD PRESSURE SUPPORT): 80.0000 ug | PREFILLED_SYRINGE | INTRAVENOUS | Status: DC | PRN

## 2013-07-07 MED ORDER — LANOLIN HYDROUS EX OINT: TOPICAL_OINTMENT | CUTANEOUS | Status: DC | PRN

## 2013-07-07 MED ORDER — CITRIC ACID-SODIUM CITRATE 334-500 MG/5ML PO SOLN: 30.0000 mL | ORAL | Status: DC | PRN

## 2013-07-07 MED ORDER — LIDOCAINE HCL (PF) 1 % IJ SOLN
INTRAMUSCULAR | Status: DC | PRN
  Administered 2013-07-07 (×2): 8 mL

## 2013-07-07 MED ORDER — FENTANYL 2.5 MCG/ML BUPIVACAINE 1/10 % EPIDURAL INFUSION (WH - ANES)
INTRAMUSCULAR | Status: DC | PRN
  Administered 2013-07-07: 14 mL/h via EPIDURAL

## 2013-07-07 MED ORDER — IBUPROFEN 600 MG PO TABS: 600.0000 mg | ORAL_TABLET | Freq: Four times a day (QID) | ORAL | Status: DC | PRN

## 2013-07-07 MED ORDER — EPHEDRINE 5 MG/ML INJ: 10.0000 mg | INTRAVENOUS | Status: DC | PRN

## 2013-07-07 MED ORDER — LACTATED RINGERS IV SOLN
INTRAVENOUS | Status: DC
  Administered 2013-07-07 (×2): via INTRAVENOUS

## 2013-07-07 MED ORDER — LACTATED RINGERS IV SOLN: INTRAVENOUS | Status: AC

## 2013-07-07 MED ORDER — PNEUMOCOCCAL VAC POLYVALENT 25 MCG/0.5ML IJ INJ
0.5000 mL | INJECTION | INTRAMUSCULAR | Status: AC
  Administered 2013-07-08: 0.5 mL via INTRAMUSCULAR
  Filled 2013-07-07: qty 0.5

## 2013-07-07 MED ORDER — OXYCODONE-ACETAMINOPHEN 5-325 MG PO TABS: 1.0000 | ORAL_TABLET | ORAL | Status: DC | PRN

## 2013-07-07 MED ORDER — OXYTOCIN 40 UNITS IN LACTATED RINGERS INFUSION - SIMPLE MED
62.5000 mL/h | INTRAVENOUS | Status: DC
  Administered 2013-07-07: 62.5 mL/h via INTRAVENOUS
  Filled 2013-07-07: qty 1000

## 2013-07-07 MED ORDER — BENZOCAINE-MENTHOL 20-0.5 % EX AERO
1.0000 "application " | INHALATION_SPRAY | CUTANEOUS | Status: DC | PRN
  Filled 2013-07-07 (×2): qty 56

## 2013-07-07 MED ORDER — OXYTOCIN BOLUS FROM INFUSION: 500.0000 mL | INTRAVENOUS | Status: DC

## 2013-07-07 MED ORDER — ONDANSETRON HCL 4 MG PO TABS: 4.0000 mg | ORAL_TABLET | ORAL | Status: DC | PRN

## 2013-07-07 MED ORDER — PRENATAL MULTIVITAMIN CH: 1.0000 | ORAL_TABLET | Freq: Every day | ORAL | Status: DC

## 2013-07-07 MED ORDER — LIDOCAINE HCL (PF) 1 % IJ SOLN
30.0000 mL | INTRAMUSCULAR | Status: DC | PRN
  Administered 2013-07-07: 30 mL via SUBCUTANEOUS
  Filled 2013-07-07: qty 30

## 2013-07-07 MED ORDER — EPHEDRINE 5 MG/ML INJ
10.0000 mg | INTRAVENOUS | Status: DC | PRN
  Filled 2013-07-07: qty 4

## 2013-07-07 MED ORDER — IBUPROFEN 600 MG PO TABS
600.0000 mg | ORAL_TABLET | Freq: Four times a day (QID) | ORAL | Status: DC
  Administered 2013-07-07 – 2013-07-09 (×8): 600 mg via ORAL
  Filled 2013-07-07 (×8): qty 1

## 2013-07-07 MED ORDER — TETANUS-DIPHTH-ACELL PERTUSSIS 5-2.5-18.5 LF-MCG/0.5 IM SUSP
0.5000 mL | Freq: Once | INTRAMUSCULAR | Status: DC
  Filled 2013-07-07: qty 0.5

## 2013-07-07 MED ORDER — ONDANSETRON HCL 4 MG/2ML IJ SOLN: 4.0000 mg | INTRAMUSCULAR | Status: DC | PRN

## 2013-07-07 MED ORDER — DIPHENHYDRAMINE HCL 50 MG/ML IJ SOLN: 12.5000 mg | INTRAMUSCULAR | Status: DC | PRN

## 2013-07-07 MED ORDER — DIPHENHYDRAMINE HCL 25 MG PO CAPS: 25.0000 mg | ORAL_CAPSULE | Freq: Four times a day (QID) | ORAL | Status: DC | PRN

## 2013-07-07 MED ORDER — MEASLES, MUMPS & RUBELLA VAC ~~LOC~~ INJ: 0.5000 mL | INJECTION | Freq: Once | SUBCUTANEOUS | Status: DC

## 2013-07-07 MED ORDER — PRENATAL MULTIVITAMIN CH
1.0000 | ORAL_TABLET | Freq: Every day | ORAL | Status: DC
  Administered 2013-07-08 – 2013-07-09 (×2): 1 via ORAL
  Filled 2013-07-07 (×2): qty 1

## 2013-07-07 MED ORDER — DIBUCAINE 1 % RE OINT
1.0000 "application " | TOPICAL_OINTMENT | RECTAL | Status: DC | PRN
  Filled 2013-07-07: qty 28

## 2013-07-07 MED ORDER — SENNOSIDES-DOCUSATE SODIUM 8.6-50 MG PO TABS
2.0000 | ORAL_TABLET | ORAL | Status: DC
  Administered 2013-07-07 – 2013-07-08 (×2): 2 via ORAL
  Filled 2013-07-07 (×2): qty 2

## 2013-07-07 MED ORDER — FLEET ENEMA 7-19 GM/118ML RE ENEM: 1.0000 | ENEMA | RECTAL | Status: DC | PRN

## 2013-07-07 MED ORDER — SIMETHICONE 80 MG PO CHEW: 80.0000 mg | CHEWABLE_TABLET | ORAL | Status: DC | PRN

## 2013-07-07 NOTE — MAU Note (Signed)
uc's started an hour ago, also started bleeding @ that time, unsure if leaking fluid.

## 2013-07-07 NOTE — Anesthesia Procedure Notes (Addendum)
Epidural Patient location during procedure: OB Start time: 07/07/2013 9:30 AM End time: 07/07/2013 9:34 AM  Staffing Anesthesiologist: Leilani Able Performed by: anesthesiologist   Preanesthetic Checklist Completed: patient identified, surgical consent, pre-op evaluation, timeout performed, IV checked, risks and benefits discussed and monitors and equipment checked  Epidural Patient position: sitting Prep: site prepped and draped and DuraPrep Patient monitoring: continuous pulse ox and blood pressure Approach: midline Location: L3-L4 Injection technique: LOR air  Needle:  Needle type: Tuohy  Needle gauge: 17 G Needle length: 9 cm and 9 Needle insertion depth: 7 cm Catheter type: closed end flexible Catheter size: 19 Gauge Catheter at skin depth: 12 cm Test dose: negative and Other  Assessment Sensory level: T9 Events: blood not aspirated, injection not painful, no injection resistance, negative IV test and no paresthesia  Additional Notes Reason for block:procedure for pain

## 2013-07-07 NOTE — Progress Notes (Signed)
UR chart review completed.  

## 2013-07-07 NOTE — Progress Notes (Addendum)
Patient ID: Monica Sloan, female   DOB: 1985/05/01, 28 y.o.   MRN: [id number] Delivery note:   Shortly after AROM the patient complained of pressure and was 9 cm. She was prepared for delivery and after full dilatation was asked to push. She slowly brought the baby to crowning and delivered a living female infant spontaneously ROA over a second degree ML laceration. Apgars were 9 and 9 at 1 and 5 minutes. The placenta delivered intact and them uterus was normal. The cord blood collection personnel was in attendance. The ML laceration was sutured with 3-0 vicryl. EBL 400 cc's. Counts correct. There was 1 loop of nuchal cord.

## 2013-07-07 NOTE — Lactation Note (Signed)
This note was copied from the chart of Monica Kelsea Bauers. Lactation Consultation Note  Patient Name: Monica Sloan Date: 07/07/2013 Reason for consult: Initial assessment of this second-time mother and her newborn at 7 hours postpartum. Mom breastfed her 28 yo for 3 months but stopped when returning to work. She will return to a different job this time and hopes to breastfeed longer.  Mom states she knows how to express her milk by hand and has used a Medela pump before.  She is on [facility] and has [employer] and asks some questions about pump options.  Hand pump provided and mom will contact WIC or request paperwork from [facility] if she decides to rent a pump.  LC explained that medicaid cannot be billed for an electric pump rental.  Mom is holding her baby STS and he is not showing any feeding cues but has nursed once for 15 minutes.  LC encouraged continued STS and cue feedings and reviewed normal newborn sleepiness during baby's first 24 hours.   LC encouraged review of Baby and Me pp 9, 14 and 20-25 for STS and BF information. LC provided [employer] brochure and reviewed [facility] services and list of community and web site resources.    Maternal Data Formula Feeding for Exclusion: No Infant to breast within first hour of birth: Yes Has patient been taught Hand Expression?: Yes (mom informs LC that she knows how to hand express colostrum/milk) Does the patient have breastfeeding experience prior to this delivery?: Yes  Feeding Feeding Type: Breast Fed  LATCH Score/Interventions Latch: Too sleepy or reluctant, no latch achieved, no sucking elicited. Intervention(s): Waking techniques  Audible Swallowing: None  Type of Nipple: Everted at rest and after stimulation  Comfort (Breast/Nipple): Soft / non-tender     Hold (Positioning): Assistance needed to correctly position infant at breast and maintain latch. Intervention(s): Support Pillows;Position options  LATCH Score: 5  (previous feeding attempt when baby too sleepy to nurse)  Lactation Tools Discussed/Used   STS, hand expression, cue feedings  Consult Status Consult Status: Follow-up Date: 07/08/13 Follow-up type: In-patient    Zara Chess 07/07/2013, 8:56 PM

## 2013-07-07 NOTE — Anesthesia Preprocedure Evaluation (Signed)
Anesthesia Evaluation  Patient identified by MRN, date of birth, ID band Patient awake    Reviewed: Allergy & Precautions, H&P , NPO status , Patient's Chart, lab work & pertinent test results  Airway Mallampati: II TM Distance: >3 FB Neck ROM: full    Dental no notable dental hx.    Pulmonary former smoker,    Pulmonary exam normal       Cardiovascular negative cardio ROS      Neuro/Psych negative neurological ROS  negative psych ROS   GI/Hepatic negative GI ROS, Neg liver ROS,   Endo/Other  negative endocrine ROS  Renal/GU negative Renal ROS     Musculoskeletal   Abdominal Normal abdominal exam  (+)   Peds  Hematology negative hematology ROS (+)   Anesthesia Other Findings   Reproductive/Obstetrics (+) Pregnancy                           Anesthesia Physical Anesthesia Plan  ASA: II  Anesthesia Plan: Epidural   Post-op Pain Management:    Induction:   Airway Management Planned:   Additional Equipment:   Intra-op Plan:   Post-operative Plan:   Informed Consent: I have reviewed the patients History and Physical, chart, labs and discussed the procedure including the risks, benefits and alternatives for the proposed anesthesia with the patient or authorized representative who has indicated his/her understanding and acceptance.     Plan Discussed with:   Anesthesia Plan Comments:         Anesthesia Quick Evaluation  

## 2013-07-07 NOTE — Progress Notes (Signed)
Patient ID: Monica Sloan, female   DOB: 11-06-1985, 28 y.o.   MRN: [id number] Pt is contracting but the contractions are difficult to trace . The cervix is 4-5 cm 90 % effaced and the vertex is at - 2 station. AROM produced clear fluid. If progress does not occur will start pitocin.

## 2013-07-07 NOTE — H&P (Signed)
NAMEKAO, MCKEONE NO.:  [id number]  MEDICAL RECORD NO.:  [id number]  LOCATION:  9171                          FACILITY:  [facility]  PHYSICIAN:  Malachi Pro. Ambrose Mantle, M.D. DATE OF BIRTH:  [birth year]  DATE OF ADMISSION:  07/07/2013 DATE OF DISCHARGE:                             HISTORY & PHYSICAL   PRESENT ILLNESS:  This is a 28 year old black female, para 1-0-2-1, gravida 4 with EDC on Jul 11, 2013 admitted in early labor.  Blood group and type A positive, negative antibody.  RPR negative.  Urine culture negative.  Hepatitis B surface antigen negative, HIV negative, GC and Chlamydia negative, rubella immune, Pap smear normal.  First trimester screen negative, AFP negative, 1 hour Glucola 76.  Repeat HIV and VDRL negative.  Group B strep negative.  TSH normal.  The patient began her prenatal course at 10 weeks 6 days.  Initially, the TSH was slightly low, but free T4 was okay.  Dr. Lucianne Muss followed.  Her last prenatal visit on May 18, her cervix is 2 cm, 80%.  The patient's prenatal course was essentially uncomplicated.  PAST MEDICAL HISTORY:  Revealed problems with her thyroid, not on medication, sickle cell trait, and postpartum depression.  SURGICAL HISTORY:  D and Cs in 2008 and 2009.  ALLERGIES:  NO KNOWN DRUG ALLERGIES.  NO LATEX ALLERGY.  ALLERGIC TO CHOCOLATE CAUSED HIVES, WINE CAUSED HIVES, AND PEANUTS CAUSED HER TONGUE TO TINGLE.  SOCIAL HISTORY:  Former smoker.  No alcohol.  Works as a Conservation officer, nature at [employer]  She is single.  FAMILY HISTORY:  Mother with high blood pressure and heart murmur. Father with diabetes.  Maternal grandmother with diabetes and high blood pressure.  Paternal grandfather with diabetes and tumor of the prostate that was malignant.  Maternal cousins with breast cancer in the 19s, ovarian cancer in the late 40s, uterine cancer and heart disease.  PHYSICAL EXAMINATION:  VITAL SIGNS:  On admission, blood pressure 119/69, pulse 68,  respirations 20. HEART:  Normal size and sounds.  No murmurs. LUNGS:  Clear to auscultation. Fundal height at her last prenatal visit was 38 cm.  Fetal heart tones are normal.  According to the maternity admission nurse, the cervix was 4 cm, 80%.  She could not tell the presenting part, so a bedside ultrasound was done, confirmed by the radiology staff that the vertex was presenting.  ADMITTING IMPRESSION:  Intrauterine pregnancy at 39+ weeks, in active labor.  The patient is admitted and her platelet count is acceptable and the epidural has been requested.     Malachi Pro. Ambrose Mantle, M.D.     TFH/MEDQ  D:  07/07/2013  T:  07/07/2013  Job:  [id number]

## 2013-07-08 LAB — CBC
HEMATOCRIT: 28.2 % — AB (ref 36.0–46.0)
HEMOGLOBIN: 9.7 g/dL — AB (ref 12.0–15.0)
MCH: 32 pg (ref 26.0–34.0)
MCHC: 34.4 g/dL (ref 30.0–36.0)
MCV: 93.1 fL (ref 78.0–100.0)
Platelets: 248 10*3/uL (ref 150–400)
RBC: 3.03 MIL/uL — ABNORMAL LOW (ref 3.87–5.11)
RDW: 13.3 % (ref 11.5–15.5)
WBC: 11.9 10*3/uL — ABNORMAL HIGH (ref 4.0–10.5)

## 2013-07-08 NOTE — Progress Notes (Deleted)
Patient ID: Monica Sloan, female   DOB: [birth year], 28 y.o.   MRN: [id number] Circ note:   Circ with 1.1 cm plastibell. 1cc 1 % xylocaine ring block No apparent complications.

## 2013-07-08 NOTE — Lactation Note (Signed)
This note was copied from the chart of Monica Sloan. Lactation Consultation Note Baby sleeping after circ.  Mother states her nipples are sore.  Both pink, right nipple cracked. Reviewed applying ebm and air dry.  Reviewed suck training since mother states baby is pushing nipple out. Encouraged mother to call for assistance with next feeding. Patient Name: Monica Sloan [name] Date: 07/08/2013 Reason for consult: Follow-up assessment   Maternal Data    Feeding Length of feed: 10 min  LATCH Score/Interventions                      Lactation Tools Discussed/Used     Consult Status Consult Status: Follow-up Date: 07/09/13 Follow-up type: In-patient    Dulce Sellar Berkelhammer 07/08/2013, 11:42 AM

## 2013-07-08 NOTE — Progress Notes (Signed)
Patient ID: Monica Sloan, female   DOB: 12-20-1985, 28 y.o.   MRN: [id number] #1 afebrile BP normal HGB 12.3 to 9.7

## 2013-07-08 NOTE — Anesthesia Postprocedure Evaluation (Signed)
Anesthesia Post Note  Patient: Monica Sloan  Procedure(s) Performed: * No procedures listed *  Anesthesia type: Epidural  Patient location: Mother/Baby  Post pain: Pain level controlled  Post assessment: Post-op Vital signs reviewed  Last Vitals:  Filed Vitals:   07/08/13 0530  BP: 97/65  Pulse: 74  Temp: 36.5 C  Resp: 18    Post vital signs: Reviewed  Level of consciousness: awake  Complications: No apparent anesthesia complications

## 2013-07-08 NOTE — Progress Notes (Signed)
Pt. Advised of needing blood  Drawn due to  Blood exposure of infant to nursing staff in nursery during circumcision.  She agrees to exposure panel  Being drawn.  Tresa Endo, RN [facility]

## 2013-07-09 MED ORDER — IBUPROFEN 600 MG PO TABS: 600.0000 mg | ORAL_TABLET | Freq: Four times a day (QID) | ORAL | Status: DC | PRN

## 2013-07-09 MED ORDER — OXYCODONE-ACETAMINOPHEN 5-325 MG PO TABS: 1.0000 | ORAL_TABLET | Freq: Four times a day (QID) | ORAL | Status: DC | PRN

## 2013-07-09 NOTE — Discharge Summary (Signed)
NAMEPRERANA, JODOIN NO.:  [id number]  MEDICAL RECORD NO.:  [id number]  LOCATION:  9106                          FACILITY:  [facility]  PHYSICIAN:  Malachi Pro. Ambrose Mantle, M.D. DATE OF BIRTH:  1986-01-23  DATE OF ADMISSION:  07/07/2013 DATE OF DISCHARGE:  07/09/2013                              DISCHARGE SUMMARY   This is a 28 year old black female, para 1-0-2-1, gravida 4, EDC Jul 11, 2013, admitted in early labor.  All her prenatal tests including the first trimester screen and AFP were negative.  The patient came to the hospital in maternity admission unit.  Nurses felt the cervix was 4 cm dilated, because she could not tell the presenting part.  A bedside ultrasound by the radiology staff showed the baby was vertex.  The patient requested an epidural.  By 12:50 p.m., the cervix was 4-5 cm, 90%, vertex at a -2, artificial rupture of the membranes produced clear fluid.  Shortly after artificial rupture of the membranes, the patient complained of pressure and was 9 cm.  She was prepared for delivery and after full dilatation, was asked to push, slowly brought the baby to crowning and delivered a living female infant spontaneously ROA over a second-degree midline laceration.  Apgars were 9 and 9 at 1 and 5 minutes.  Placenta delivered intact.  Uterus was normal.  Cord blood collection personnel was in attendance.  Midline laceration sutured with 3-0 Vicryl.  Blood loss 400 mL.  Counts correct.  There was 1 loop of nuchal cord.  Postpartum, the patient did well and was discharged on the second postpartum day.  Laboratory data showed initial hemoglobin of 12.3, hematocrit 34.9, white count 8400, platelet count 300,000.  RPR was nonreactive.  Followup hemoglobin 9.7.  FINAL DIAGNOSES:  Intrauterine pregnancy at 39 plus weeks, delivered right occiput anterior.  OPERATION:  Spontaneous delivery, ROA, repair of second-degree midline laceration.  The patient had considered  sterilization.  She had not signed her forms.  Discussion about sterilization and other methods of birth control were done.  The patient is to return to the office in 6 weeks for followup examination.  Prescriptions for Motrin 600 mg 30 tablets, 1 every 6 hours as needed for pain and Percocet 5/325, 30 tablets, 1 every 6 hours as needed for pain given at discharge.  She is to return to the office in 6 weeks for followup examination.     Malachi Pro. Ambrose Mantle, M.D.     TFH/MEDQ  D:  07/09/2013  T:  07/09/2013  Job:  [id number]
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
## 2013-07-09 NOTE — Progress Notes (Signed)
Patient ID: Monica Sloan, female   DOB: July 16, 1985, 28 y.o.   MRN: [id number] #[id number] afebrile BP normal for d/c

## 2013-07-09 NOTE — Discharge Instructions (Signed)
booklet °

## 2013-07-13 ENCOUNTER — Ambulatory Visit (HOSPITAL_COMMUNITY)
Admission: RE | Admit: 2013-07-13 | Discharge: 2013-07-13 | Disposition: A | Discharge status: Home / Self Care | Payer: Medicaid Other | Source: Ambulatory Visit | Attending: Obstetrics and Gynecology | Admitting: Obstetrics and Gynecology

## 2013-07-13 NOTE — Lactation Note (Signed)
Adult Lactation Consultation Outpatient Visit Note  Patient Name: Monica Sloan                                      "Duwayne Heck" Date of Birth: [birth year]                                                6 days Gestational Age at Delivery: [redacted]w[redacted]d                                Weight today: 7-2,3232 Type of Delivery:   Breastfeeding History: Frequency of Breastfeeding: every 1.5-2 hours  Length of Feeding: 30 mins Voids: 8-9 Stools: 5 yellow seedy  Supplementing / Method: Pumping:  Type of Pump: Medela PIS   Frequency:every 2 hours for 30 mins  Volume:  1.5-2 ounces  Comments: Mother states she is having difficulty getting infant on both breast. Mother states that infant is biting and chewing the entire feeding. Mother states that she has burning pain on the (R) breast . Mother has bilateral cracking. Mother has been using comfort gels. Mother has been pumping.     Consultation Evaluation: Mother assisted with latch on (R) breast. On initial latch mother complained of severe pain. Removed infant from breast and relatched. Adjusted infant lower jaw for wider gape and rolled top lip up. Mother states no pain at all. No observed biting or chewing. Infant  sustained latch for 20 mins and transferred 50 ml Mothers (L) nipple is cracked. She was afraid of latching infant.  Recommend that she get all purpose nipple cream and allow tissue to heal, then begin to offer (L) again. Taught mother breast compression Suggested that mother pre pump the (L) breast to firm nipple and soften areola.  Infant does have a slight tight frenula , but has good tongue mobility.    Initial Feeding Assessment: Pre-feed [id number] Post-feed XMIWOE:3212 Amount Transferred:50 ml Comments:   Total Breast milk Transferred this Visit: 50 ml Total Supplement Given:   Additional Interventions: Mother phone OB MD while here to get RX for all purpose nipple ointment Instruct mother to continue to cue base feed  and at least every 2-3 hours. Advised mother to wait for infant to open wide before bringing infant on breast quickly Lots of tips for latching infant deep Recommend that mother pump for only 20 mins after feeding Recommend that mother double pump at least 4-6 times a day until milk in good. Advised mother to nap frequently and drink to thirst   Follow-Up   PRN   Richarda Blade Nikoleta Dady 07/13/2013, 4:18 PM

## 2013-09-26 ENCOUNTER — Encounter (HOSPITAL_BASED_OUTPATIENT_CLINIC_OR_DEPARTMENT_OTHER): Payer: Self-pay | Admitting: Emergency Medicine

## 2013-09-26 ENCOUNTER — Emergency Department (HOSPITAL_BASED_OUTPATIENT_CLINIC_OR_DEPARTMENT_OTHER)
Admission: EM | Admit: 2013-09-26 | Discharge: 2013-09-26 | Disposition: A | Discharge status: Home / Self Care | Payer: Medicaid Other | Attending: Emergency Medicine | Admitting: Emergency Medicine

## 2013-09-26 DIAGNOSIS — H5789 Other specified disorders of eye and adnexa: Secondary | ICD-10-CM | POA: Insufficient documentation

## 2013-09-26 DIAGNOSIS — H02846 Edema of left eye, unspecified eyelid: Secondary | ICD-10-CM

## 2013-09-26 DIAGNOSIS — Z862 Personal history of diseases of the blood and blood-forming organs and certain disorders involving the immune mechanism: Secondary | ICD-10-CM | POA: Diagnosis not present

## 2013-09-26 DIAGNOSIS — J45909 Unspecified asthma, uncomplicated: Secondary | ICD-10-CM | POA: Insufficient documentation

## 2013-09-26 DIAGNOSIS — Z8639 Personal history of other endocrine, nutritional and metabolic disease: Secondary | ICD-10-CM | POA: Insufficient documentation

## 2013-09-26 DIAGNOSIS — Z87891 Personal history of nicotine dependence: Secondary | ICD-10-CM | POA: Insufficient documentation

## 2013-09-26 MED ORDER — ERYTHROMYCIN 5 MG/GM OP OINT
TOPICAL_OINTMENT | Freq: Once | OPHTHALMIC | Status: AC
  Administered 2013-09-26: 11:00:00 via OPHTHALMIC
  Filled 2013-09-26: qty 3.5

## 2013-09-26 NOTE — Discharge Instructions (Signed)
Return to the ED with any concerns including redness of eye, changes in vision, increased redness or swelling especially if it begins to affect eye or face, fever/chills, decreased level of alertness/lethargy, or any other alarming symptoms  You should use the erythromycin ointment twice daily for 7 days, take benadryl every 6 hours for the next 2-3 days then space out to an as needed basis

## 2013-09-26 NOTE — ED Notes (Signed)
Patient here with left eyelid swelling x 1 day, redness noted to same.

## 2013-09-27 NOTE — ED Provider Notes (Signed)
CSN: [id number]     Arrival date & time 09/26/13  [id number] History   First MD Initiated Contact with Patient 09/26/13 1014     Chief Complaint  Patient presents with  . eyelid swelling      (Consider location/radiation/quality/duration/timing/severity/associated sxs/prior Treatment) HPI Pt presenting with left eyelid swelling.  Pt states symptoms began yesterday.  Area was both itching and hurting.  She tried taken benadryl without relief.  No changes in vision. No redness of eye itself.  No fever.  There are no other associated systemic symptoms, there are no other alleviating or modifying factors.   Past Medical History  Diagnosis Date  . Asthma   . Seasonal allergies   . Sickle cell trait   . Thyroid disease    Past Surgical History  Procedure Laterality Date  . Dilation and curettage of uterus  2009    x's 2    Family History  Problem Relation Age of Onset  . Arthritis Mother   . Colon cancer      Maternal Second Cousin  . Arthritis Father   . Ovarian cancer Mother   . Breast cancer      Maternal second cousin  . Prostate cancer Paternal Grandfather   . Diabetes Paternal Grandfather   . Lung cancer      Maternal Great Uncle  . Hyperlipidemia Father   . Heart disease Cousin   . Heart murmur Mother   . Hypertension Mother   . Hypertension Maternal Grandmother   . Kidney disease Maternal Grandmother   . Diabetes Father   . Emotional abuse Father     Depression   History  Substance Use Topics  . Smoking status: Former Smoker    Types: Cigarettes    Quit date: 03/15/2012  . Smokeless tobacco: Never Used  . Alcohol Use: No   OB History   Grav Para Term Preterm Abortions TAB SAB Ect Mult Living   4 2 2  0 2 0 2 0 0 2     Review of Systems ROS reviewed and all otherwise negative except for mentioned in HPI    Allergies  Chocolate; Peanuts; and Wine  Home Medications   Prior to Admission medications   Medication Sig Start Date End Date Taking? Authorizing  Provider  Prenatal Vit-Fe Fumarate-FA (PRENATAL MULTIVITAMIN) TABS tablet Take 1 tablet by mouth daily at 12 noon.    Historical Provider, MD   BP 137/92  Pulse 61  Temp(Src) 98.1 F (36.7 C) (Oral)  Resp 18  SpO2 99% Vitals reviewed Physical Exam Physical Examination: General appearance - alert, well appearing, and in no distress Mental status - alert, oriented to person, place, and time Eyes - no conjunctival injection, no scleral icterus, mild swelling and erythema of left upper eyelid- possible early stye seen on upper eyelid margin, PERRL, EOMI without pain- no surrounding erythema of face Mouth - mucous membranes moist, pharynx normal without lesions Extremities - peripheral pulses normal, no pedal edema, no clubbing or cyanosis Skin - normal coloration and turgor, no rashes  ED Course  Procedures (including critical care time) Labs Review Labs Reviewed - No data to display  Imaging Review No results found.   EKG Interpretation None      MDM   Final diagnoses:  Swelling of eyelid, left    Pt presenting with c/o left eyelid swelling.  Possible early stye- no signs of orbital or periorbital cellulitis.  Pt given erythromycin ointment.  Discharged with strict return precautions.  Pt  agreeable with plan.    Ethelda Chick, MD 09/27/13 [phone]

## 2013-11-10 ENCOUNTER — Emergency Department (HOSPITAL_BASED_OUTPATIENT_CLINIC_OR_DEPARTMENT_OTHER): Payer: Medicaid Other

## 2013-11-10 ENCOUNTER — Emergency Department (HOSPITAL_BASED_OUTPATIENT_CLINIC_OR_DEPARTMENT_OTHER)
Admission: EM | Admit: 2013-11-10 | Discharge: 2013-11-10 | Disposition: A | Discharge status: Home / Self Care | Payer: Medicaid Other | Attending: Emergency Medicine | Admitting: Emergency Medicine

## 2013-11-10 ENCOUNTER — Encounter (HOSPITAL_BASED_OUTPATIENT_CLINIC_OR_DEPARTMENT_OTHER): Payer: Self-pay | Admitting: Emergency Medicine

## 2013-11-10 DIAGNOSIS — Z8639 Personal history of other endocrine, nutritional and metabolic disease: Secondary | ICD-10-CM | POA: Insufficient documentation

## 2013-11-10 DIAGNOSIS — R110 Nausea: Secondary | ICD-10-CM | POA: Diagnosis not present

## 2013-11-10 DIAGNOSIS — J0100 Acute maxillary sinusitis, unspecified: Secondary | ICD-10-CM | POA: Diagnosis not present

## 2013-11-10 DIAGNOSIS — J45901 Unspecified asthma with (acute) exacerbation: Secondary | ICD-10-CM | POA: Insufficient documentation

## 2013-11-10 DIAGNOSIS — Z862 Personal history of diseases of the blood and blood-forming organs and certain disorders involving the immune mechanism: Secondary | ICD-10-CM | POA: Diagnosis not present

## 2013-11-10 DIAGNOSIS — Z87891 Personal history of nicotine dependence: Secondary | ICD-10-CM | POA: Diagnosis not present

## 2013-11-10 DIAGNOSIS — R51 Headache: Secondary | ICD-10-CM | POA: Insufficient documentation

## 2013-11-10 DIAGNOSIS — J40 Bronchitis, not specified as acute or chronic: Secondary | ICD-10-CM

## 2013-11-10 MED ORDER — AMOXICILLIN 500 MG PO CAPS
500.0000 mg | ORAL_CAPSULE | Freq: Three times a day (TID) | ORAL | Status: DC
Start: 2013-11-10 — End: 2014-03-04

## 2013-11-10 MED ORDER — ACETAMINOPHEN 500 MG PO TABS
1000.0000 mg | ORAL_TABLET | Freq: Once | ORAL | Status: AC
  Administered 2013-11-10: 1000 mg via ORAL
  Filled 2013-11-10: qty 2

## 2013-11-10 MED ORDER — MOMETASONE FUROATE 50 MCG/ACT NA SUSP: 2.0000 | Freq: Every day | NASAL | Status: DC

## 2013-11-10 MED ORDER — IPRATROPIUM-ALBUTEROL 0.5-2.5 (3) MG/3ML IN SOLN
3.0000 mL | RESPIRATORY_TRACT | Status: DC
  Administered 2013-11-10: 3 mL via RESPIRATORY_TRACT
  Filled 2013-11-10: qty 3

## 2013-11-10 NOTE — ED Provider Notes (Signed)
CSN: [id number]     Arrival date & time 11/10/13  1302 History   First MD Initiated Contact with Patient 11/10/13 1330     Chief Complaint  Patient presents with  . Headache     (Consider location/radiation/quality/duration/timing/severity/associated sxs/prior Treatment) Patient is a 28 y.o. female presenting with URI. The history is provided by the patient.  URI Presenting symptoms: congestion, cough, facial pain, fever and rhinorrhea   Presenting symptoms: no ear pain and no sore throat   Severity:  Moderate Onset quality:  Gradual Duration:  3 days Timing:  Constant Progression:  Worsening Chronicity:  New Relieved by:  Nothing Worsened by:  Nothing tried Ineffective treatments: tylenol. Associated symptoms: arthralgias, headaches, myalgias, sinus pain and wheezing   Risk factors: sick contacts    Monica Sloan is a 28 y.o. female who presents to the ED with cough, congestion, fever and chills. She is not sure how high her fever has been but states she felt hot. Her son has had pneumonia. She went to work today but had to leave because she started feeling so bad. She is coughing up green sputum.   Past Medical History  Diagnosis Date  . Asthma   . Seasonal allergies   . Sickle cell trait   . Thyroid disease    Past Surgical History  Procedure Laterality Date  . Dilation and curettage of uterus  2009    x's 2    Family History  Problem Relation Age of Onset  . Arthritis Mother   . Colon cancer      Maternal Second Cousin  . Arthritis Father   . Ovarian cancer Mother   . Breast cancer      Maternal second cousin  . Prostate cancer Paternal Grandfather   . Diabetes Paternal Grandfather   . Lung cancer      Maternal Great Uncle  . Hyperlipidemia Father   . Heart disease Cousin   . Heart murmur Mother   . Hypertension Mother   . Hypertension Maternal Grandmother   . Kidney disease Maternal Grandmother   . Diabetes Father   . Emotional abuse Father      Depression   History  Substance Use Topics  . Smoking status: Former Smoker    Types: Cigarettes    Quit date: 03/15/2012  . Smokeless tobacco: Never Used  . Alcohol Use: No   OB History   Grav Para Term Preterm Abortions TAB SAB Ect Mult Living   4 2 2  0 2 0 2 0 0 2     Review of Systems  Constitutional: Positive for fever and chills.  HENT: Positive for congestion and rhinorrhea. Negative for ear pain and sore throat.   Eyes: Negative for visual disturbance.  Respiratory: Positive for cough and wheezing.   Cardiovascular: Negative for chest pain and leg swelling.  Gastrointestinal: Positive for nausea. Negative for vomiting and abdominal pain.  Genitourinary: Negative for dysuria, frequency, vaginal bleeding, vaginal discharge and pelvic pain.  Musculoskeletal: Positive for arthralgias and myalgias.  Skin: Negative for rash.  Neurological: Positive for headaches.  Psychiatric/Behavioral: Negative for confusion. The patient is not nervous/anxious.       Allergies  Chocolate; Peanuts; and Wine  Home Medications   Prior to Admission medications   Medication Sig Start Date End Date Taking? Authorizing Provider  Prenatal Vit-Fe Fumarate-FA (PRENATAL MULTIVITAMIN) TABS tablet Take 1 tablet by mouth daily at 12 noon.   Yes Historical Provider, MD   BP 141/81  Pulse 84  Temp(Src) 99.2 F (37.3 C) (Oral)  Resp 18  Ht 5\' 2"  (1.575 m)  Wt 175 lb (79.379 kg)  BMI 32.00 kg/m2  SpO2 100% Physical Exam  Nursing note and vitals reviewed. Constitutional: She is oriented to person, place, and time. She appears well-developed and well-nourished.  HENT:  Head: Normocephalic.  Right Ear: Tympanic membrane normal.  Left Ear: Tympanic membrane normal.  Nose: Right sinus exhibits maxillary sinus tenderness. Left sinus exhibits maxillary sinus tenderness.  Mouth/Throat: Uvula is midline and mucous membranes are normal. Posterior oropharyngeal erythema present.  Eyes: EOM are  normal.  Neck: Neck supple.  Cardiovascular: Normal rate.   Pulmonary/Chest: She has decreased breath sounds. She has wheezes. She has rhonchi.  Abdominal: Soft. There is no tenderness.  Musculoskeletal: Normal range of motion.  Neurological: She is alert and oriented to person, place, and time. No cranial nerve deficit.  Skin: Skin is warm and dry.  Psychiatric: She has a normal mood and affect. Her behavior is normal.    ED Course  Procedures  After Duoneb the patient is feeling better. Re examined and lungs are without wheezing.  Dg Chest 2 View  11/10/2013   CLINICAL DATA:  28 year old female with fever chills body aches and intermittent shortness of breath. Cough productive of green sputum. Initial encounter.  EXAM: CHEST  2 VIEW  COMPARISON:  Chest radiographs 05/16/2012 and earlier.  FINDINGS: Basilar predominant increased interstitial markings, slightly more pronounced at the right base. No pneumothorax, pulmonary edema, pleural effusion, or confluent pulmonary opacity. Normal cardiac size and mediastinal contours. Visualized tracheal air column is within normal limits. No osseous abnormality identified.  IMPRESSION: Basilar predominant increased interstitial markings greater on the right. No focal pneumonia, but constellation is suspicious for acute viral/atypical respiratory infection.   Electronically Signed   By: Augusto GambleLee  Hall M.D.   On: 11/10/2013 14:29   BP 121/71  Pulse 111  Temp(Src) 102.2 F (39 C) (Oral)  Resp 18  Ht 5\' 2"  (1.575 m)  Wt 175 lb (79.379 kg)  BMI 32.00 kg/m2  SpO2 98% treated with tylenol 1 gram PO.  MDM   28 y.o. female with headache, cough and wheezing, sinus congestion and fever. Will treat for bronchitis and sinusitis. I have reviewed this patient's vital signs, nurses notes, appropriate labs and imaging.  I have discussed findings with the patient and plan of care and she voices understanding and agrees with plan. She will use Robitussin for her cough.     Medication List    TAKE these medications       amoxicillin 500 MG capsule  Commonly known as:  AMOXIL  Take 1 capsule (500 mg total) by mouth 3 (three) times daily.     mometasone 50 MCG/ACT nasal spray  Commonly known as:  NASONEX  Place 2 sprays into the nose daily.      ASK your doctor about these medications       prenatal multivitamin Tabs tablet  Take 1 tablet by mouth daily at 12 noon.           StanleyHope M Corinthia Helmers, [place] 11/11/13 [phone]

## 2013-11-10 NOTE — ED Notes (Signed)
C/o chills and body aches since Sunday. With SOB at times. Coughing up green sputum.

## 2013-11-13 NOTE — ED Provider Notes (Signed)
Medical screening examination/treatment/procedure(s) were performed by non-physician practitioner and as supervising physician I was immediately available for consultation/collaboration.   EKG Interpretation None        Audree CamelScott T Cambri Plourde, MD 11/13/13 [phone]

## 2013-12-14 ENCOUNTER — Encounter (HOSPITAL_BASED_OUTPATIENT_CLINIC_OR_DEPARTMENT_OTHER): Payer: Self-pay | Admitting: Emergency Medicine

## 2014-03-02 ENCOUNTER — Other Ambulatory Visit (INDEPENDENT_AMBULATORY_CARE_PROVIDER_SITE_OTHER): Payer: Medicaid Other

## 2014-03-02 ENCOUNTER — Other Ambulatory Visit: Payer: Self-pay | Admitting: *Deleted

## 2014-03-02 DIAGNOSIS — E042 Nontoxic multinodular goiter: Secondary | ICD-10-CM

## 2014-03-02 LAB — TSH: TSH: 0.53 u[IU]/mL (ref 0.35–4.50)

## 2014-03-02 LAB — T4, FREE: Free T4: 0.71 ng/dL (ref 0.60–1.60)

## 2014-03-04 ENCOUNTER — Encounter: Payer: Self-pay | Admitting: Endocrinology

## 2014-03-04 ENCOUNTER — Ambulatory Visit (INDEPENDENT_AMBULATORY_CARE_PROVIDER_SITE_OTHER): Payer: Medicaid Other | Admitting: Endocrinology

## 2014-03-04 VITALS — BP 104/60 | HR 86 | Temp 98.6°F | Resp 12 | Ht 62.0 in | Wt 194.0 lb

## 2014-03-04 DIAGNOSIS — E042 Nontoxic multinodular goiter: Secondary | ICD-10-CM

## 2014-03-04 NOTE — Progress Notes (Signed)
Patient ID: Monica Sloan, female   DOB: 01-29-1986, 29 y.o.   MRN: [id number]  Reason for Appointment:  Thyroid enlargement, follow-up     History of Present Illness:   The thyroid enlargement was first diagnosed  around 08/2012  Her initial symptoms were some discomfort in the lower neck and also was having some symptoms of feeling choked on lying down. She had a thyroid enlargement discovered by her PCP when she went for a routine visit   She had a thyroid ultrasound in 7/14 which showed the following: Heterogeneous, predominately solid 2.9 x 2.8 x 1.9 cm nodule centered in the thyroid isthmus.  There is a second smaller 1.1 x 0.9 x 0.8 cm isoechoic nodule in the posterior inferior left thyroid lobe. Needle Aspiration of this was done and results were compatible with non-neoplastic goiter  Her labs on 08/13/12 showed TSH of 0.374, free T4 normal at 1.17 and a total T3 normal at 118  Lab Results  Component Value Date   TSH 0.53 03/02/2014   TSH 0.47 04/17/2011   FREET4 0.71 03/02/2014    She has not followed up since her initial visit in 09/2012 but is now here for follow-up. She does not complain of any choking sensation or discomfort She does feel that the swelling is somewhat annoying Thyroid levels have been rechecked as above   Past Medical History  Diagnosis Date  . Asthma   . Seasonal allergies   . Sickle cell trait   . Thyroid disease     Past Surgical History  Procedure Laterality Date  . Dilation and curettage of uterus  2009    x's 2     Family History  Problem Relation Age of Onset  . Arthritis Mother   . Colon cancer      Maternal Second Cousin  . Arthritis Father   . Ovarian cancer Mother   . Breast cancer      Maternal second cousin  . Prostate cancer Paternal Grandfather   . Diabetes Paternal Grandfather   . Lung cancer      Maternal Great Uncle  . Hyperlipidemia Father   . Heart disease Cousin   . Heart murmur Mother   .  Hypertension Mother   . Hypertension Maternal Grandmother   . Kidney disease Maternal Grandmother   . Diabetes Father   . Emotional abuse Father     Depression  gm  Social History:  reports that she quit smoking about 1 years ago. Her smoking use included Cigarettes. She has never used smokeless tobacco. She reports that she does not drink alcohol or use illicit drugs.  Allergies:  Allergies  Allergen Reactions  . Chocolate Swelling  . Peanuts [Peanut Oil] Hives and Swelling  . Wine [Alcohol] Swelling      Medication List       This list is accurate as of: 03/04/14 10:22 AM.  Always use your most recent med list.               amoxicillin 500 MG capsule  Commonly known as:  AMOXIL  Take 1 capsule (500 mg total) by mouth 3 (three) times daily.     mometasone 50 MCG/ACT nasal spray  Commonly known as:  NASONEX  Place 2 sprays into the nose daily.     prenatal multivitamin Tabs tablet  Take 1 tablet by mouth daily at 12 noon.        Review of Systems:     ENDOCRINOLOGY:  no history of Diabetes, believes she has had prediabetes but was not having any difficulties during her recent pregnancy      Examination:    BP 104/60 mmHg  Pulse 86  Temp(Src) 98.6 F (37 C) (Oral)  Resp 12  Ht 5\' 2"  (1.575 m)  Wt 194 lb (87.998 kg)  BMI 35.47 kg/m2  SpO2 97%   General Appearance:  she looks well         Eyes: No prominence or eyelid swelling .          Neck: The thyroid exam shows a 4.5 cm oval nodule in the midline, relatively smooth and only slightly firm. Also has a relatively firmer area on the right medial lobe about 2 cm across. Left side is just palpable and smooth, no nodule distinctly felt. Pemberton sign negative. No stridor heard  There is no lymphadenopathy .    Neck circumference is 37 cm Neurological: REFLEXES: at biceps are normal. Extremities: No edema   Assessments    Multinodular goiter with stable dominant benign nodule.  Currently does not  complain of any pressure symptoms and her exam is unchanged from before  Treatment:    Now TSH is not in the low side and indicates euthyroid status Since she is not a candidate for thyroid suppression will continue to monitor her annually Consider repeat ultrasound if she has any increase in size of the nodule; however since the palpable dominant nodule has been benign on biopsy would not need this now She is comfortable with observation only and may consider surgery only if she has significant compressive symptoms   Kyi Romanello 03/04/2014, 10:22 AM

## 2015-03-02 ENCOUNTER — Other Ambulatory Visit: Payer: Medicaid Other

## 2015-03-07 ENCOUNTER — Ambulatory Visit: Payer: Medicaid Other | Admitting: Endocrinology

## 2015-03-07 ENCOUNTER — Encounter: Payer: Self-pay | Admitting: *Deleted

## 2015-03-07 ENCOUNTER — Telehealth: Payer: Self-pay | Admitting: Endocrinology

## 2015-03-07 NOTE — Telephone Encounter (Signed)
Letter mailed

## 2015-03-07 NOTE — Telephone Encounter (Signed)
Patient no showed today's appt. Please advise on how to follow up. °A. No follow up necessary. °B. Follow up urgent. Contact patient immediately. °C. Follow up necessary. Contact patient and schedule visit in ___ days. °D. Follow up advised. Contact patient and schedule visit in ____weeks. ° °

## 2015-03-09 NOTE — Telephone Encounter (Signed)
Spoke with pt she is going to call back she was driving at the time

## 2015-06-09 MED FILL — ALL DAY ALLERGY 10 MG TAB: 10 | 30 days supply | Qty: 30 | Fill #0

## 2015-06-09 MED FILL — MONTELUKAST SOD 10 MG TAB: 10 | 30 days supply | Qty: 30 | Fill #0

## 2015-06-09 MED FILL — NASONEX 50 MCG NASAL SPRAY: 50 | 30 days supply | Qty: 17 | Fill #0

## 2015-06-09 MED FILL — PROAIR HFA 90 MCG INHALER: 108 (90 BAS | 30 days supply | Qty: 9 | Fill #0

## 2015-12-13 ENCOUNTER — Encounter (HOSPITAL_COMMUNITY): Payer: Self-pay | Admitting: Emergency Medicine

## 2015-12-13 ENCOUNTER — Emergency Department (HOSPITAL_COMMUNITY)
Admission: EM | Admit: 2015-12-13 | Discharge: 2015-12-13 | Disposition: A | Discharge status: Home / Self Care | Payer: Medicaid Other | Attending: Emergency Medicine | Admitting: Emergency Medicine

## 2015-12-13 ENCOUNTER — Emergency Department (HOSPITAL_COMMUNITY): Payer: Medicaid Other

## 2015-12-13 DIAGNOSIS — Y999 Unspecified external cause status: Secondary | ICD-10-CM | POA: Insufficient documentation

## 2015-12-13 DIAGNOSIS — S62336A Displaced fracture of neck of fifth metacarpal bone, right hand, initial encounter for closed fracture: Secondary | ICD-10-CM | POA: Diagnosis not present

## 2015-12-13 DIAGNOSIS — S6991XA Unspecified injury of right wrist, hand and finger(s), initial encounter: Secondary | ICD-10-CM | POA: Diagnosis present

## 2015-12-13 DIAGNOSIS — Y929 Unspecified place or not applicable: Secondary | ICD-10-CM | POA: Insufficient documentation

## 2015-12-13 DIAGNOSIS — Z91010 Allergy to peanuts: Secondary | ICD-10-CM | POA: Insufficient documentation

## 2015-12-13 DIAGNOSIS — J45909 Unspecified asthma, uncomplicated: Secondary | ICD-10-CM | POA: Diagnosis not present

## 2015-12-13 DIAGNOSIS — F17210 Nicotine dependence, cigarettes, uncomplicated: Secondary | ICD-10-CM | POA: Diagnosis not present

## 2015-12-13 DIAGNOSIS — Y939 Activity, unspecified: Secondary | ICD-10-CM | POA: Insufficient documentation

## 2015-12-13 DIAGNOSIS — W228XXA Striking against or struck by other objects, initial encounter: Secondary | ICD-10-CM | POA: Diagnosis not present

## 2015-12-13 MED ORDER — IBUPROFEN 600 MG PO TABS: 600.0000 mg | ORAL_TABLET | Freq: Four times a day (QID) | ORAL | 0 refills | Status: DC | PRN

## 2015-12-13 NOTE — ED Triage Notes (Signed)
Pt states she punched a wall yesterday and is concerned she broke her right hand. Cannot move pinky finger. Hand is swollen.

## 2015-12-13 NOTE — ED Notes (Signed)
Ortho tech paged  

## 2015-12-13 NOTE — Progress Notes (Signed)
Orthopedic Tech Progress Note Patient Details:  Monica Sloan 11-Jun-1985 [id number]  Ortho Devices Type of Ortho Device: Ulna gutter splint, Ace wrap Ortho Device/Splint Interventions: Application   Saul FordyceJennifer C Karnisha Lefebre 12/13/2015, 12:18 PM

## 2015-12-13 NOTE — ED Notes (Signed)
Ortho tech notified.  

## 2015-12-13 NOTE — ED Provider Notes (Signed)
[facility]EM[facility]ENCY DEPT Provider Note   CSN: [id number] Arrival date & time: 12/13/15  4098  By signing my name below, I, Sonum Patel, attest that this documentation has been prepared under the direction and in the presence of Melburn Hake, [place]. Electronically Signed: Sonum Patel, Neurosurgeon. 12/13/15. 11:32 AM.  History   Chief Complaint Chief Complaint  Patient presents with  . Hand Injury    The history is provided by the patient. No language interpreter was used.    HPI Comments: Monica Sloan is a 30 y.o. female who presents to the Emergency Department complaining of constant, unchanged right hand pain with associated swelling and bruising that began yesterday after she punched a wall. She has not taken any OTC medications for her symptoms. She denies numbness, tingling, weakness. Denies any other prior injuries.    Past Medical History:  Diagnosis Date  . Asthma   . Seasonal allergies   . Sickle cell trait (HCC)   . Thyroid disease     Patient Active Problem List   Diagnosis Date Noted  . Indication for care in labor or delivery 07/07/2013  . SVD (spontaneous vaginal delivery) 07/07/2013  . Multinodular goiter 10/01/2012  . Asthma, intermittent controlled 04/17/2011    Past Surgical History:  Procedure Laterality Date  . DILATION AND CURETTAGE OF UTERUS  2009   x's 2     OB History    Gravida Para Term Preterm AB Living   4 2 2  0 2 2   SAB TAB Ectopic Multiple Live Births   2 0 0 0 2       Home Medications    Prior to Admission medications   Medication Sig Start Date End Date Taking? Authorizing Provider  mometasone (NASONEX) 50 MCG/ACT nasal spray Place 2 sprays into the nose daily. Patient not taking: Reported on 03/04/2014 11/10/13   Janne Napoleon, NP  Prenatal Vit-Fe Fumarate-FA (PRENATAL MULTIVITAMIN) TABS tablet Take 1 tablet by mouth daily at 12 noon.    Historical Provider, MD    Family History Family History  Problem Relation Age of Onset  .  Arthritis Mother   . Ovarian cancer Mother   . Heart murmur Mother   . Hypertension Mother   . Arthritis Father   . Hyperlipidemia Father   . Diabetes Father   . Emotional abuse Father     Depression  . Colon cancer      Maternal Second Cousin  . Breast cancer      Maternal second cousin  . Prostate cancer Paternal Grandfather   . Diabetes Paternal Grandfather   . Lung cancer      Maternal Great Uncle  . Heart disease Cousin   . Hypertension Maternal Grandmother   . Kidney disease Maternal Grandmother     Social History Social History  Substance Use Topics  . Smoking status: Current Some Day Smoker    Types: Cigarettes  . Smokeless tobacco: Never Used  . Alcohol use No     Allergies   Chocolate; Peanuts [peanut oil]; and Wine [alcohol]   Review of Systems Review of Systems  Musculoskeletal: Positive for arthralgias and joint swelling.  Neurological: Negative for weakness and numbness.     Physical Exam Updated Vital Signs BP 129/68 (BP Location: Left Arm)   Pulse 65   Temp 98.6 F (37 C) (Oral)   Resp 14   LMP 11/28/2015   SpO2 99%   Physical Exam  Constitutional: She is oriented to person, place, and time.  She appears well-developed and well-nourished.  HENT:  Head: Normocephalic and atraumatic.  Eyes: Conjunctivae and EOM are normal. Right eye exhibits no discharge. Left eye exhibits no discharge. No scleral icterus.  Neck: Normal range of motion. Neck supple.  Cardiovascular: Normal rate and intact distal pulses.   Pulmonary/Chest: Effort normal.  Musculoskeletal: She exhibits edema and tenderness. She exhibits no deformity.       Right wrist: She exhibits normal range of motion, no tenderness, no swelling, no effusion, no crepitus, no deformity and no laceration.       Right forearm: She exhibits no tenderness, no swelling, no edema, no deformity and no laceration.       Right hand: She exhibits tenderness and swelling. She exhibits normal range of  motion, normal capillary refill, no deformity and no laceration. Normal sensation noted. Decreased strength (due to pain) noted.       Hands: Right hand: Moderate swelling and ecchymoses over right 4th and 5th metacarpals. Tenderness to palpation. Decreased ROM to right 4th and 5th digits due to pain and swelling.  Sensation gross intact. Cap refill < 2 sec. 2+ radial pulse. Full ROM of right wrist, forearm, elbow. No lacerations or abrasions noted.   Neurological: She is alert and oriented to person, place, and time.  Skin: Skin is warm and dry. Capillary refill takes less than 2 seconds.  Nursing note and vitals reviewed.    ED Treatments / Results  DIAGNOSTIC STUDIES: Oxygen Saturation is 99% on RA, normal by my interpretation.    COORDINATION OF CARE: 11:30 AM Discussed treatment plan with pt at bedside and pt agreed to plan.    Labs (all labs ordered are listed, but only abnormal results are displayed) Labs Reviewed - No data to display  EKG  EKG Interpretation None       Radiology Dg Hand Complete Right  Result Date: 12/13/2015 CLINICAL DATA:  Right hand pain and swelling after punching wall yesterday. EXAM: RIGHT HAND - COMPLETE 3+ VIEW COMPARISON:  None. FINDINGS: Moderately displaced and comminuted fracture is seen involving the distal fifth metacarpal. No other fracture or bony abnormality is noted. Joint spaces are intact. No soft tissue abnormality is noted. IMPRESSION: Moderately displaced and comminuted distal fifth metacarpal fracture. Electronically Signed   By: Lupita RaiderJames  Green Jr, M.D.   On: 12/13/2015 09:50    Procedures Procedures (including critical care time)  Medications Ordered in ED Medications - No data to display  SPLINT APPLICATION Date/Time: 12:26 PM Authorized by: Barrett HenleNicole Elizabeth Aniyah Nobis Consent: Verbal consent obtained. Risks and benefits: risks, benefits and alternatives were discussed Consent given by: patient Splint applied by: orthopedic  technician Location details: right hand Splint type: ulnar gutter splint  Post-procedure: The splinted body part was neurovascularly unchanged following the procedure. Patient tolerance: Patient tolerated the procedure well with no immediate complications.     Initial Impression / Assessment and Plan / ED Course  I have reviewed the triage vital signs and the nursing notes.  Pertinent labs & imaging results that were available during my care of the patient were reviewed by me and considered in my medical decision making (see chart for details).  Clinical Course    Patient X-Ray positive for fx of right 5th distal metacarpal, moderately displaced and comminuted. Right hand remains or vascular intact. Ulnar gutter splint applied in the ED. Pt declined any pain meds. Pt advised to follow up with orthopedics. Patient d/c home with conservative therapy recommended including RICE protocol and NSAIDs. Patient  will be discharged home & is agreeable with above plan. Returns precautions discussed. Pt appears safe for discharge.   Final Clinical Impressions(s) / ED Diagnoses   Final diagnoses:  None    New Prescriptions New Prescriptions   No medications on file   I personally performed the services described in this documentation, which was scribed in my presence. The recorded information has been reviewed and is accurate.    Satira Sarkicole Elizabeth SumnerNadeau, [place]-C 12/13/15 1227    Marily MemosJason Mesner, MD 12/15/15 [phone]

## 2015-12-13 NOTE — ED Notes (Signed)
[facility] 181 GIVEN

## 2015-12-13 NOTE — Discharge Instructions (Signed)
I recommend continuing to rest, elevate and ice the right hand for 15 minutes 3-4 times daily to help with pain and swelling. You may take your prescription as prescribed as needed for pain relief.  Call the orthopedic office listed above today to schedule a follow-up appointment within the next week for reevaluation regarding your right fifth distal metacarpal fracture. Please return to the Emergency Department if symptoms worsen or new onset of fever, redness, swelling, warmth, numbness, tingling, decreased range of motion.

## 2016-02-13 NOTE — L&D Delivery Note (Signed)
Delivery Note Pt arrived, got epidural and progressed rapidly to C/C/+1 - with pressure.  Pt pushed very well for delivery.  At 3:38 AM a viable and healthy female was delivered via Vaginal, Spontaneous Delivery (Presentation: OA; LOT ).  APGAR: 8, 9; weight  P.   Placenta status: delivered, intact - had some old blood and questionable adherent clot to pathology .  Cord: 3V with the following complications: loose nuchal and true knot .    Anesthesia:  epidural Episiotomy: None Lacerations: None Suture Repair: N/A Est. Blood Loss (mL):  300  Mom to postpartum.  Baby to Couplet care / Skin to Skin.  Kaytee Taliercio Bovard-Stuckert 09/06/2016, 3:54 AM  D/W pt circumcision - pt desires at office  Br/A+/RI/Tdap in PNC/desires PP BTL - papers signed  D/w pt r/b/a of PP BTL, wants to proceed.

## 2016-03-06 LAB — OB RESULTS CONSOLE RPR: RPR: NONREACTIVE

## 2016-03-06 LAB — OB RESULTS CONSOLE ABO/RH: RH TYPE: POSITIVE

## 2016-03-06 LAB — OB RESULTS CONSOLE HIV ANTIBODY (ROUTINE TESTING): HIV: NONREACTIVE

## 2016-03-06 LAB — OB RESULTS CONSOLE ANTIBODY SCREEN: Antibody Screen: NEGATIVE

## 2016-03-06 LAB — OB RESULTS CONSOLE RUBELLA ANTIBODY, IGM: Rubella: IMMUNE

## 2016-03-06 LAB — OB RESULTS CONSOLE HEPATITIS B SURFACE ANTIGEN: Hepatitis B Surface Ag: NEGATIVE

## 2016-03-06 LAB — OB RESULTS CONSOLE GC/CHLAMYDIA
Chlamydia: NEGATIVE
GC PROBE AMP, GENITAL: NEGATIVE

## 2016-08-30 LAB — OB RESULTS CONSOLE GBS: STREP GROUP B AG: POSITIVE

## 2016-09-06 ENCOUNTER — Inpatient Hospital Stay (HOSPITAL_COMMUNITY): Payer: Medicaid Other | Admitting: Anesthesiology

## 2016-09-06 ENCOUNTER — Encounter (HOSPITAL_COMMUNITY): Payer: Self-pay | Admitting: *Deleted

## 2016-09-06 ENCOUNTER — Inpatient Hospital Stay (HOSPITAL_COMMUNITY)
Admission: AD | Admit: 2016-09-06 | Discharge: 2016-09-08 | DRG: 775 | Disposition: A | Discharge status: Home / Self Care | Payer: Medicaid Other | Source: Ambulatory Visit | Attending: Obstetrics and Gynecology | Admitting: Obstetrics and Gynecology

## 2016-09-06 ENCOUNTER — Encounter (HOSPITAL_COMMUNITY)
Admission: AD | Disposition: A | Discharge status: Home / Self Care | Payer: Self-pay | Source: Ambulatory Visit | Attending: Obstetrics and Gynecology

## 2016-09-06 DIAGNOSIS — F17210 Nicotine dependence, cigarettes, uncomplicated: Secondary | ICD-10-CM | POA: Diagnosis present

## 2016-09-06 DIAGNOSIS — O99284 Endocrine, nutritional and metabolic diseases complicating childbirth: Secondary | ICD-10-CM | POA: Diagnosis present

## 2016-09-06 DIAGNOSIS — O99824 Streptococcus B carrier state complicating childbirth: Secondary | ICD-10-CM | POA: Diagnosis present

## 2016-09-06 DIAGNOSIS — E049 Nontoxic goiter, unspecified: Secondary | ICD-10-CM | POA: Diagnosis present

## 2016-09-06 DIAGNOSIS — Z3A37 37 weeks gestation of pregnancy: Secondary | ICD-10-CM

## 2016-09-06 DIAGNOSIS — J45909 Unspecified asthma, uncomplicated: Secondary | ICD-10-CM | POA: Diagnosis present

## 2016-09-06 DIAGNOSIS — O9952 Diseases of the respiratory system complicating childbirth: Secondary | ICD-10-CM | POA: Diagnosis present

## 2016-09-06 DIAGNOSIS — O99334 Smoking (tobacco) complicating childbirth: Secondary | ICD-10-CM | POA: Diagnosis present

## 2016-09-06 DIAGNOSIS — D573 Sickle-cell trait: Secondary | ICD-10-CM | POA: Diagnosis present

## 2016-09-06 DIAGNOSIS — O26893 Other specified pregnancy related conditions, third trimester: Secondary | ICD-10-CM | POA: Diagnosis present

## 2016-09-06 DIAGNOSIS — O9902 Anemia complicating childbirth: Secondary | ICD-10-CM | POA: Diagnosis present

## 2016-09-06 LAB — CBC
HCT: 32.2 % — ABNORMAL LOW (ref 36.0–46.0)
HEMOGLOBIN: 11.3 g/dL — AB (ref 12.0–15.0)
MCH: 32 pg (ref 26.0–34.0)
MCHC: 35.1 g/dL (ref 30.0–36.0)
MCV: 91.2 fL (ref 78.0–100.0)
PLATELETS: 324 10*3/uL (ref 150–400)
RBC: 3.53 MIL/uL — AB (ref 3.87–5.11)
RDW: 12.8 % (ref 11.5–15.5)
WBC: 9.8 10*3/uL (ref 4.0–10.5)

## 2016-09-06 LAB — RPR: RPR Ser Ql: NONREACTIVE

## 2016-09-06 LAB — TYPE AND SCREEN
ABO/RH(D): A POS
ANTIBODY SCREEN: NEGATIVE

## 2016-09-06 SURGERY — LIGATION, FALLOPIAN TUBE, POSTPARTUM
Anesthesia: Epidural | Laterality: Bilateral

## 2016-09-06 MED ORDER — ONDANSETRON HCL 4 MG/2ML IJ SOLN: 4.0000 mg | Freq: Four times a day (QID) | INTRAMUSCULAR | Status: DC | PRN

## 2016-09-06 MED ORDER — LACTATED RINGERS IV SOLN
INTRAVENOUS | Status: DC
  Administered 2016-09-06: 03:00:00 via INTRAVENOUS

## 2016-09-06 MED ORDER — ACETAMINOPHEN 325 MG PO TABS
650.0000 mg | ORAL_TABLET | ORAL | Status: DC | PRN
Start: 2016-09-06 — End: 2016-09-08
  Administered 2016-09-07 – 2016-09-08 (×2): 650 mg via ORAL
  Filled 2016-09-06 (×2): qty 2

## 2016-09-06 MED ORDER — PRENATAL MULTIVITAMIN CH
1.0000 | ORAL_TABLET | Freq: Every day | ORAL | Status: DC
  Administered 2016-09-06 – 2016-09-08 (×3): 1 via ORAL
  Filled 2016-09-06 (×3): qty 1

## 2016-09-06 MED ORDER — WITCH HAZEL-GLYCERIN EX PADS: 1.0000 "application " | MEDICATED_PAD | CUTANEOUS | Status: DC | PRN

## 2016-09-06 MED ORDER — COCONUT OIL OIL: 1.0000 "application " | TOPICAL_OIL | Status: DC | PRN

## 2016-09-06 MED ORDER — ZOLPIDEM TARTRATE 5 MG PO TABS
5.0000 mg | ORAL_TABLET | Freq: Every evening | ORAL | Status: DC | PRN
Start: 2016-09-06 — End: 2016-09-08

## 2016-09-06 MED ORDER — TETANUS-DIPHTH-ACELL PERTUSSIS 5-2.5-18.5 LF-MCG/0.5 IM SUSP
0.5000 mL | Freq: Once | INTRAMUSCULAR | Status: DC
Start: 2016-09-07 — End: 2016-09-08

## 2016-09-06 MED ORDER — EPHEDRINE 5 MG/ML INJ
10.0000 mg | INTRAVENOUS | Status: DC | PRN
  Filled 2016-09-06: qty 2

## 2016-09-06 MED ORDER — BENZOCAINE-MENTHOL 20-0.5 % EX AERO: 1.0000 "application " | INHALATION_SPRAY | CUTANEOUS | Status: DC | PRN

## 2016-09-06 MED ORDER — PENICILLIN G POTASSIUM 5000000 UNITS IJ SOLR
5.0000 10*6.[IU] | Freq: Once | INTRAVENOUS | Status: AC
  Administered 2016-09-06: 5 10*6.[IU] via INTRAVENOUS
  Filled 2016-09-06: qty 5

## 2016-09-06 MED ORDER — ONDANSETRON HCL 4 MG/2ML IJ SOLN: 4.0000 mg | INTRAMUSCULAR | Status: DC | PRN

## 2016-09-06 MED ORDER — DIPHENHYDRAMINE HCL 25 MG PO CAPS: 25.0000 mg | ORAL_CAPSULE | Freq: Four times a day (QID) | ORAL | Status: DC | PRN

## 2016-09-06 MED ORDER — PENICILLIN G POT IN DEXTROSE 60000 UNIT/ML IV SOLN
3.0000 10*6.[IU] | INTRAVENOUS | Status: DC
  Filled 2016-09-06 (×2): qty 50

## 2016-09-06 MED ORDER — PHENYLEPHRINE 40 MCG/ML (10ML) SYRINGE FOR IV PUSH (FOR BLOOD PRESSURE SUPPORT)
80.0000 ug | PREFILLED_SYRINGE | INTRAVENOUS | Status: DC | PRN
  Filled 2016-09-06: qty 5

## 2016-09-06 MED ORDER — OXYTOCIN 40 UNITS IN LACTATED RINGERS INFUSION - SIMPLE MED
2.5000 [IU]/h | INTRAVENOUS | Status: DC
  Administered 2016-09-06: 2.5 [IU]/h via INTRAVENOUS
  Filled 2016-09-06: qty 1000

## 2016-09-06 MED ORDER — OXYCODONE-ACETAMINOPHEN 5-325 MG PO TABS: 2.0000 | ORAL_TABLET | ORAL | Status: DC | PRN

## 2016-09-06 MED ORDER — PHENYLEPHRINE 40 MCG/ML (10ML) SYRINGE FOR IV PUSH (FOR BLOOD PRESSURE SUPPORT)
80.0000 ug | PREFILLED_SYRINGE | INTRAVENOUS | Status: DC | PRN
  Filled 2016-09-06: qty 5
  Filled 2016-09-06: qty 10

## 2016-09-06 MED ORDER — LIDOCAINE HCL (PF) 1 % IJ SOLN
INTRAMUSCULAR | Status: DC | PRN
  Administered 2016-09-06 (×2): 5 mL via EPIDURAL

## 2016-09-06 MED ORDER — FLEET ENEMA 7-19 GM/118ML RE ENEM: 1.0000 | ENEMA | RECTAL | Status: DC | PRN

## 2016-09-06 MED ORDER — ACETAMINOPHEN 325 MG PO TABS: 650.0000 mg | ORAL_TABLET | ORAL | Status: DC | PRN

## 2016-09-06 MED ORDER — IBUPROFEN 600 MG PO TABS
600.0000 mg | ORAL_TABLET | Freq: Four times a day (QID) | ORAL | Status: DC
  Administered 2016-09-06 – 2016-09-08 (×9): 600 mg via ORAL
  Filled 2016-09-06 (×10): qty 1

## 2016-09-06 MED ORDER — LACTATED RINGERS IV SOLN: INTRAVENOUS | Status: DC

## 2016-09-06 MED ORDER — FENTANYL 2.5 MCG/ML BUPIVACAINE 1/10 % EPIDURAL INFUSION (WH - ANES)
14.0000 mL/h | INTRAMUSCULAR | Status: DC | PRN
  Administered 2016-09-06: 14 mL/h via EPIDURAL
  Filled 2016-09-06: qty 100

## 2016-09-06 MED ORDER — ONDANSETRON HCL 4 MG PO TABS
4.0000 mg | ORAL_TABLET | ORAL | Status: DC | PRN
Start: 2016-09-06 — End: 2016-09-08

## 2016-09-06 MED ORDER — LACTATED RINGERS IV SOLN: 500.0000 mL | INTRAVENOUS | Status: DC | PRN

## 2016-09-06 MED ORDER — SENNOSIDES-DOCUSATE SODIUM 8.6-50 MG PO TABS
2.0000 | ORAL_TABLET | ORAL | Status: DC
  Administered 2016-09-07 – 2016-09-08 (×2): 2 via ORAL
  Filled 2016-09-06 (×2): qty 2

## 2016-09-06 MED ORDER — SOD CITRATE-CITRIC ACID 500-334 MG/5ML PO SOLN: 30.0000 mL | ORAL | Status: DC | PRN

## 2016-09-06 MED ORDER — LIDOCAINE HCL (PF) 1 % IJ SOLN
30.0000 mL | INTRAMUSCULAR | Status: DC | PRN
  Filled 2016-09-06: qty 30

## 2016-09-06 MED ORDER — OXYTOCIN BOLUS FROM INFUSION
500.0000 mL | Freq: Once | INTRAVENOUS | Status: AC
  Administered 2016-09-06: 500 mL via INTRAVENOUS

## 2016-09-06 MED ORDER — DIBUCAINE 1 % RE OINT: 1.0000 "application " | TOPICAL_OINTMENT | RECTAL | Status: DC | PRN

## 2016-09-06 MED ORDER — DIPHENHYDRAMINE HCL 50 MG/ML IJ SOLN: 12.5000 mg | INTRAMUSCULAR | Status: DC | PRN

## 2016-09-06 MED ORDER — SIMETHICONE 80 MG PO CHEW: 80.0000 mg | CHEWABLE_TABLET | ORAL | Status: DC | PRN

## 2016-09-06 MED ORDER — LACTATED RINGERS IV SOLN
500.0000 mL | Freq: Once | INTRAVENOUS | Status: AC
  Administered 2016-09-06: 500 mL via INTRAVENOUS

## 2016-09-06 MED ORDER — OXYCODONE-ACETAMINOPHEN 5-325 MG PO TABS: 1.0000 | ORAL_TABLET | ORAL | Status: DC | PRN
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
## 2016-09-06 NOTE — MAU Note (Signed)
Contractions started yesterday and they have continued today.  Got more intense around 1800 yesterday evening.  Denies LOF/VB.

## 2016-09-06 NOTE — Progress Notes (Signed)
Patient ID: Monica CostaLakisha Sandridge, female   DOB: [birth year], 31 y.o.   MRN: [id number] PPD #1 No problems Afeb, VSS Fundus firm, NT at U-1 Continue routine postpartum care.  No acceptable times in OR to do PPBTL, she is agreeable to do interval BTL.

## 2016-09-06 NOTE — Lactation Note (Signed)
This note was copied from a baby's chart. Lactation Consultation Note: Mother reports that she breastfed her first child for 3 months and her second for 2 yrs. Infant is 37.1 weeks and weights 5-7. Mother was given LPI parent instruction sheet with review of newborn behaviors and supplemental parameters .  Mother advised to follow supplemental guidelines. Infant latched on after several attempts. Observed frequent suckling and swallows. Infant sustained latch for 10-15 mins. Mother was sat up with a DEBP and advised to post pump for 15-20 mins. After each feeding. She was advised to supplement infant with EBM/Formula after each feeding. Mother is able to hand express good volume from left breast. She plans to spoon feed infant after this feeding. Mother advised to cue base feed infant and at least 8-12 times in 24 hours. Mother receptive to all teaching.   Patient Name: Monica Ivor CostaLakisha Sloan ZOXWR'UToday's Date: 09/06/2016 Reason for consult: Initial assessment   Maternal Data    Feeding Feeding Type: Breast Fed Length of feed: 15 min  LATCH Score/Interventions Latch: Repeated attempts needed to sustain latch, nipple held in mouth throughout feeding, stimulation needed to elicit sucking reflex. Intervention(s): Adjust position;Assist with latch;Breast compression  Audible Swallowing: Spontaneous and intermittent Intervention(s): Skin to skin;Hand expression  Type of Nipple: Everted at rest and after stimulation  Comfort (Breast/Nipple): Soft / non-tender     Hold (Positioning): Assistance needed to correctly position infant at breast and maintain latch. Intervention(s): Breastfeeding basics reviewed;Support Pillows;Position options;Skin to skin  LATCH Score: 8  Lactation Tools Discussed/Used     Consult Status      Monica Sloan, Monica Sloan 09/06/2016, 11:57 AM

## 2016-09-06 NOTE — Anesthesia Postprocedure Evaluation (Signed)
Anesthesia Post Note  Patient: Monica Sloan  Procedure(s) Performed: * No procedures listed *     Patient location during evaluation: Mother Baby Anesthesia Type: Epidural Level of consciousness: awake, awake and alert, oriented and patient cooperative Pain management: pain level controlled Vital Signs Assessment: post-procedure vital signs reviewed and stable Respiratory status: spontaneous breathing, nonlabored ventilation and respiratory function stable Cardiovascular status: stable Postop Assessment: no headache, no backache, patient able to bend at knees and no signs of nausea or vomiting Anesthetic complications: no    Last Vitals:  Vitals:   09/06/16 0600 09/06/16 0650  BP: (!) 100/59 (!) 97/45  Pulse: 63 65  Resp: 18 16  Temp: 36.6 C 36.6 C    Last Pain:  Vitals:   09/06/16 0650  TempSrc: Oral  PainSc: 0-No pain   Pain Goal:                 Tayten Bergdoll L

## 2016-09-06 NOTE — Anesthesia Procedure Notes (Signed)
Epidural Patient location during procedure: OB Start time: 09/06/2016 2:49 AM End time: 09/06/2016 3:01 AM  Staffing Anesthesiologist: Heather RobertsSINGER, Penney Domanski Performed: anesthesiologist   Preanesthetic Checklist Completed: patient identified, site marked, pre-op evaluation, timeout performed, IV checked, risks and benefits discussed and monitors and equipment checked  Epidural Patient position: sitting Prep: DuraPrep Patient monitoring: heart rate, cardiac monitor, continuous pulse ox and blood pressure Approach: midline Location: L2-L3 Injection technique: LOR saline  Needle:  Needle type: Tuohy  Needle gauge: 17 G Needle length: 9 cm Needle insertion depth: 7 cm Catheter size: 20 Guage Catheter at skin depth: 12 cm Test dose: negative and Other  Assessment Events: blood not aspirated, injection not painful, no injection resistance and negative IV test  Additional Notes Informed consent obtained prior to proceeding including risk of failure, 1% risk of PDPH, risk of minor discomfort and bruising.  Discussed rare but serious complications including epidural abscess, permanent nerve injury, epidural hematoma.  Discussed alternatives to epidural analgesia and patient desires to proceed.  Timeout performed pre-procedure verifying patient name, procedure, and platelet count.  Patient tolerated procedure well.

## 2016-09-06 NOTE — Anesthesia Preprocedure Evaluation (Signed)
Anesthesia Evaluation  Patient identified by MRN, date of birth, ID band Patient awake    Reviewed: Allergy & Precautions, H&P , NPO status , Patient's Chart, lab work & pertinent test results  History of Anesthesia Complications Negative for: history of anesthetic complications  Airway Mallampati: II  TM Distance: >3 FB Neck ROM: full    Dental no notable dental hx. (+) Dental Advisory Given   Pulmonary Current Smoker,    Pulmonary exam normal        Cardiovascular negative cardio ROS Normal cardiovascular exam     Neuro/Psych negative neurological ROS  negative psych ROS   GI/Hepatic negative GI ROS, Neg liver ROS,   Endo/Other  negative endocrine ROS  Renal/GU negative Renal ROS     Musculoskeletal   Abdominal Normal abdominal exam  (+)   Peds  Hematology  (+) Blood dyscrasia, Sickle cell trait ,   Anesthesia Other Findings   Reproductive/Obstetrics (+) Pregnancy                             Anesthesia Physical  Anesthesia Plan  ASA: II  Anesthesia Plan: Epidural   Post-op Pain Management:    Induction:   PONV Risk Score and Plan:   Airway Management Planned: Natural Airway  Additional Equipment:   Intra-op Plan:   Post-operative Plan:   Informed Consent: I have reviewed the patients History and Physical, chart, labs and discussed the procedure including the risks, benefits and alternatives for the proposed anesthesia with the patient or authorized representative who has indicated his/her understanding and acceptance.   Dental advisory given  Plan Discussed with: Anesthesiologist  Anesthesia Plan Comments:         Anesthesia Quick Evaluation

## 2016-09-07 LAB — CBC
HEMATOCRIT: 26.3 % — AB (ref 36.0–46.0)
Hemoglobin: 9.2 g/dL — ABNORMAL LOW (ref 12.0–15.0)
MCH: 32.3 pg (ref 26.0–34.0)
MCHC: 35 g/dL (ref 30.0–36.0)
MCV: 92.3 fL (ref 78.0–100.0)
Platelets: 257 10*3/uL (ref 150–400)
RBC: 2.85 MIL/uL — ABNORMAL LOW (ref 3.87–5.11)
RDW: 13.5 % (ref 11.5–15.5)
WBC: 10.2 10*3/uL (ref 4.0–10.5)
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
## 2016-09-07 NOTE — Progress Notes (Signed)
Post Partum Day 2 Subjective: no complaints, up ad lib, voiding, tolerating PO and nl lochia, pain controlled  Objective: Blood pressure 106/70, pulse 79, temperature 98.5 F (36.9 C), temperature source Oral, resp. rate 18, height 5\' 1"  (1.549 m), weight 86.2 kg (190 lb), last menstrual period 11/28/2015, SpO2 100 %, unknown if currently breastfeeding.  Physical Exam:  General: alert and no distress Lochia: appropriate Uterine Fundus: firm   Recent Labs  09/06/16 0225 09/07/16 0539  HGB 11.3* 9.2*  HCT 32.2* 26.3*    Assessment/Plan: Plan for discharge tomorrow.  Routine PP care.     LOS: 1 day   Woodley Petzold Bovard-Stuckert 09/07/2016, 7:31 AM

## 2016-09-08 MED ORDER — PRENATAL MULTIVITAMIN CH: 1.0000 | ORAL_TABLET | Freq: Every day | ORAL | 3 refills | Status: DC

## 2016-09-08 MED ORDER — IBUPROFEN 800 MG PO TABS: 800.0000 mg | ORAL_TABLET | Freq: Three times a day (TID) | ORAL | 1 refills | Status: DC | PRN
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
## 2016-09-08 NOTE — Progress Notes (Addendum)
Post Partum Day 2 Subjective: no complaints, up ad lib, voiding, tolerating PO and nl lochia, pain controlled  Objective: Blood pressure 114/72, pulse 68, temperature 98.4 F (36.9 C), temperature source Oral, resp. rate 18, height 5\' 1"  (1.549 m), weight 86.2 kg (190 lb), last menstrual period 11/28/2015, SpO2 100 %, unknown if currently breastfeeding.  Physical Exam:  General: alert and no distress Lochia: appropriate Uterine Fundus: firm   Recent Labs  09/06/16 0225 09/07/16 0539  HGB 11.3* 9.2*  HCT 32.2* 26.3*    Assessment/Plan: Discharge home.  D/c with motrin and PNV.  F/u 6 weeks.     LOS: 2 days   Monica Sloan 09/08/2016, 8:25 AM

## 2016-09-08 NOTE — Lactation Note (Signed)
This note was copied from a baby's chart. Lactation Consultation Note  Patient Name: Boy Ivor CostaLakisha Mcguinness ZOXWR'UToday's Date: 09/08/2016 Reason for consult: Follow-up assessment  Baby 54 hours old. Mom reports that baby has been nursing well and has not been sleepy at the breast. Discussed how to supplement EBM if baby becomes sleepy at breast, and also how this could impact breast milk supply. Mom states that she knows how to express and spoon-feed, and mom given curve-tipped syringe with review. Mom aware of LPI guidelines, OP/BFSG and LC phone line assistance after D/C.   Maternal Data    Feeding Feeding Type: Breast Fed Length of feed: 20 min  LATCH Score/Interventions                      Lactation Tools Discussed/Used     Consult Status Consult Status: PRN    Sherlyn HayJennifer D Cheo Selvey 09/08/2016, 10:16 AM

## 2016-10-23 ENCOUNTER — Encounter: Payer: Self-pay | Admitting: Endocrinology

## 2016-10-23 ENCOUNTER — Ambulatory Visit (INDEPENDENT_AMBULATORY_CARE_PROVIDER_SITE_OTHER): Payer: Medicaid Other | Admitting: Endocrinology

## 2016-10-23 VITALS — BP 120/74 | HR 54 | Ht 62.0 in | Wt 183.6 lb

## 2016-10-23 DIAGNOSIS — E042 Nontoxic multinodular goiter: Secondary | ICD-10-CM

## 2016-10-23 NOTE — Progress Notes (Signed)
Patient ID: Monica Sloan, female   DOB: Jul 15, 1985, 31 y.o.   MRN: [id number]  Reason for Appointment:  Thyroid enlargement, follow-up     History of Present Illness:   The thyroid enlargement was first diagnosed  around 08/2012  Her initial symptoms were some discomfort in the lower neck and also was having some symptoms of feeling choked on lying down. She had a thyroid enlargement discovered by her PCP when she went for a routine visit   She had a thyroid ultrasound in 7/14 which showed the following: Heterogeneous, predominately solid 2.9 x 2.8 x 1.9 cm nodule centered in the thyroid isthmus.  There is a second smaller 1.1 x 0.9 x 0.8 cm isoechoic nodule in the posterior inferior left thyroid lobe. Needle Aspiration of this was done and results were compatible with non-neoplastic goiter  She has not followed up since her last visit in 02/2014 He says that during her recent pregnancy her gynecologist had checked her thyroid levels and there were reportedly normal but records not available  She does not complain of any pressure sensation, difficulty with breathing or discomfort in the neck No difficulty swallowing She does feel that the swelling in the neck herself and does not think it is larger   Lab Results  Component Value Date   TSH 0.53 03/02/2014   TSH 0.47 04/17/2011   FREET4 0.71 03/02/2014      Past Medical History:  Diagnosis Date  . Asthma   . Seasonal allergies   . Sickle cell trait (HCC)   . SVD (spontaneous vaginal delivery) 09/06/2016  . Thyroid disease     Past Surgical History:  Procedure Laterality Date  . DILATION AND CURETTAGE OF UTERUS  2009   x's 2     Family History  Problem Relation Age of Onset  . Arthritis Mother   . Ovarian cancer Mother   . Heart murmur Mother   . Hypertension Mother   . Arthritis Father   . Hyperlipidemia Father   . Diabetes Father   . Emotional abuse Father        Depression  . Colon cancer  Unknown        Maternal Second Cousin  . Breast cancer Unknown        Maternal second cousin  . Prostate cancer Paternal Grandfather   . Diabetes Paternal Grandfather   . Lung cancer Unknown        Maternal Great Uncle  . Heart disease Cousin   . Hypertension Maternal Grandmother   . Kidney disease Maternal Grandmother   gm  Social History:  reports that she has been smoking Cigarettes.  She has never used smokeless tobacco. She reports that she does not drink alcohol or use drugs.  Allergies:  Allergies  Allergen Reactions  . Chocolate Swelling  . Peanuts [Peanut Oil] Hives and Swelling  . Wine [Alcohol] Swelling    Allergies as of 10/23/2016      Reactions   Chocolate Swelling   Peanuts [peanut Oil] Hives, Swelling   Wine [alcohol] Swelling      Medication List       Accurate as of 10/23/16  1:36 PM. Always use your most recent med list.          ibuprofen 800 MG tablet Commonly known as:  ADVIL,MOTRIN Take 1 tablet (800 mg total) by mouth every 8 (eight) hours as needed.   prenatal multivitamin Tabs tablet Take 1 tablet by mouth daily at 12  noon.       Review of Systems:  She is on prenatal vitamins   Examination:    BP 120/74   Pulse (!) 54   Ht 5\' 2"  (1.575 m)   Wt 183 lb 9.6 oz (83.3 kg)   SpO2 90%   BMI 33.58 kg/m    General Appearance:  she looks well         Eyes: No prominence or swelling .          Neck: The thyroid exam shows a 5 cm oval nodule in the midline, relatively smooth and slightly firm. Also has a relatively firmer area on the right medial lobe about 3 cm across. Left side is Enlarged about twice normal, and smooth . Pemberton sign negative. No stridor heard   Neck circumference is 39 cm  Neurological: REFLEXES: at biceps are normal. No tremor Extremities: No edema   Assessments    Multinodular goiter with some enlargement of her thyroid especially the larger nodules in the midline and right These may be related to her  recent pregnancy Currently does not complain of any pressure symptoms or difficulty swallowing  Treatment:    Will review records from gynecologist for her thyroid level and not sure they are normal We will see her back in 6 months to follow-up to ensure that her thyroid swelling is going down Again discussed with her that if she is having pressure symptoms will not need potential surgery Also discussed potential for hyperthyroidism with larger nodules given her TSH tends to be low normal   Amyla Heffner 10/23/2016, 1:36 PM

## 2016-11-19 ENCOUNTER — Telehealth: Payer: Self-pay | Admitting: Endocrinology

## 2016-11-19 NOTE — Telephone Encounter (Signed)
Will need to have her come in for thyroid levels and follow-up visit as soon as possible

## 2016-11-19 NOTE — Telephone Encounter (Signed)
Pt states that since her appt the thyroid has grown and has become more uncomfortable. Please advise-can she make an appt for sooner  Has noticed voice changing and feels like she is having issues with breathing

## 2016-11-20 NOTE — Telephone Encounter (Signed)
Left a vm requesting the patient call back so that we can schedule her follow up

## 2016-11-21 ENCOUNTER — Ambulatory Visit (INDEPENDENT_AMBULATORY_CARE_PROVIDER_SITE_OTHER): Payer: Medicaid Other | Admitting: Endocrinology

## 2016-11-21 ENCOUNTER — Encounter: Payer: Self-pay | Admitting: Endocrinology

## 2016-11-21 VITALS — BP 114/78 | HR 77 | Ht 62.0 in | Wt 189.4 lb

## 2016-11-21 DIAGNOSIS — E042 Nontoxic multinodular goiter: Secondary | ICD-10-CM | POA: Diagnosis not present

## 2016-11-21 LAB — T4, FREE: FREE T4: 0.76 ng/dL (ref 0.60–1.60)

## 2016-11-21 LAB — TSH: TSH: 0.87 u[IU]/mL (ref 0.35–4.50)

## 2016-11-21 NOTE — Progress Notes (Signed)
Patient ID: Monica Sloan, female   DOB: 02/08/1986, 31 y.o.   MRN: [id number]  Reason for Appointment:  Thyroid enlargement, follow-up     History of Present Illness:   The thyroid enlargement was first diagnosed  around 08/2012  Her initial symptoms were some discomfort in the lower neck and also was having some symptoms of feeling choked on lying down. She had a thyroid enlargement discovered by her PCP when she went for a routine visit   She had a thyroid ultrasound in 7/14 which showed the following: Heterogeneous, predominately solid 2.9 x 2.8 x 1.9 cm nodule centered in the thyroid isthmus.  There is a second smaller 1.1 x 0.9 x 0.8 cm isoechoic nodule in the posterior inferior left thyroid lobe. Needle Aspiration of this was done and results were compatible with non-neoplastic goiter  Recent history:  She was not having any symptoms about 4 weeks ago when she was here for a visit However she says that since then she has started having a pressure sensation in her neck and she feels like her air is being cut off when she periods She has some difficulty with her voice especially when singing She is also occasionally feeling of choking sensation but no difficulty swallowing  No difficulty swallowing She does feel that the swelling in the neck is larger She also things that she feels more hot than usual Has more fatigue   Lab Results  Component Value Date   TSH 0.53 03/02/2014   TSH 0.47 04/17/2011   FREET4 0.71 03/02/2014      Past Medical History:  Diagnosis Date  . Asthma   . Seasonal allergies   . Sickle cell trait (HCC)   . SVD (spontaneous vaginal delivery) 09/06/2016  . Thyroid disease     Past Surgical History:  Procedure Laterality Date  . DILATION AND CURETTAGE OF UTERUS  2009   x's 2     Family History  Problem Relation Age of Onset  . Arthritis Mother   . Ovarian cancer Mother   . Heart murmur Mother   . Hypertension Mother   .  Arthritis Father   . Hyperlipidemia Father   . Diabetes Father   . Emotional abuse Father        Depression  . Colon cancer Unknown        Maternal Second Cousin  . Breast cancer Unknown        Maternal second cousin  . Prostate cancer Paternal Grandfather   . Diabetes Paternal Grandfather   . Lung cancer Unknown        Maternal Great Uncle  . Heart disease Cousin   . Hypertension Maternal Grandmother   . Kidney disease Maternal Grandmother   gm  Social History:  reports that she has been smoking Cigarettes.  She has never used smokeless tobacco. She reports that she does not drink alcohol or use drugs.  Allergies:  Allergies  Allergen Reactions  . Chocolate Swelling  . Peanuts [Peanut Oil] Hives and Swelling  . Wine [Alcohol] Swelling    Allergies as of 11/21/2016      Reactions   Chocolate Swelling   Peanuts [peanut Oil] Hives, Swelling   Wine [alcohol] Swelling      Medication List       Accurate as of 11/21/16  3:57 PM. Always use your most recent med list.          ibuprofen 800 MG tablet Commonly known as:  ADVIL,MOTRIN  Take 1 tablet (800 mg total) by mouth every 8 (eight) hours as needed.   prenatal multivitamin Tabs tablet Take 1 tablet by mouth daily at 12 noon.       Review of Systems:  She is on prenatal vitamins   Examination:    BP 114/78   Pulse 77   Ht  (1.575 m)   Wt 189 lb 6.4 oz (85.9 kg)   SpO2 98%   BMI 34.64 kg/m    General Appearance:  she looks well         THYROID The right lobe is slightly firm and more prominent laterally and nodular extending to the isthmus Left side is Enlarged about 2.5 times normal, and smooth, appears bilobed and extending laterally  Pemberton sign negative. No stridor heard   Neck circumference is 39.5 cm  Neurological: REFLEXES: at biceps are slightly brisk. No tremor Extremities: No edema   Assessments    Multinodular goiter with recent local pressure symptoms She has had  generally larger right lobe the thyroid although overall thyroid may be slightly larger than the last time Her neck circumference does not seem to have increased much Also no stridor on exam   Treatment:    Will check her thyroid levels today as no recent labs available Discussed potential treatment of hyperthyroidism or hypothyroidism but if her thyroid levels are low normal will refer her for surgery because of local pressure symptoms   Kelton Bultman 11/21/2016, 3:57 PM

## 2016-11-22 ENCOUNTER — Other Ambulatory Visit: Payer: Self-pay | Admitting: Endocrinology

## 2016-11-22 DIAGNOSIS — E042 Nontoxic multinodular goiter: Secondary | ICD-10-CM

## 2017-04-22 ENCOUNTER — Ambulatory Visit: Payer: Medicaid Other | Admitting: Endocrinology

## 2017-06-17 ENCOUNTER — Encounter (HOSPITAL_BASED_OUTPATIENT_CLINIC_OR_DEPARTMENT_OTHER): Payer: Self-pay

## 2017-06-17 ENCOUNTER — Emergency Department (HOSPITAL_BASED_OUTPATIENT_CLINIC_OR_DEPARTMENT_OTHER): Payer: Medicaid Other

## 2017-06-17 ENCOUNTER — Emergency Department (HOSPITAL_BASED_OUTPATIENT_CLINIC_OR_DEPARTMENT_OTHER)
Admission: EM | Admit: 2017-06-17 | Discharge: 2017-06-17 | Disposition: A | Discharge status: Home / Self Care | Payer: Medicaid Other | Attending: Emergency Medicine | Admitting: Emergency Medicine

## 2017-06-17 DIAGNOSIS — Z79899 Other long term (current) drug therapy: Secondary | ICD-10-CM | POA: Insufficient documentation

## 2017-06-17 DIAGNOSIS — J45909 Unspecified asthma, uncomplicated: Secondary | ICD-10-CM | POA: Diagnosis not present

## 2017-06-17 DIAGNOSIS — F17210 Nicotine dependence, cigarettes, uncomplicated: Secondary | ICD-10-CM | POA: Diagnosis not present

## 2017-06-17 DIAGNOSIS — J189 Pneumonia, unspecified organism: Secondary | ICD-10-CM | POA: Diagnosis not present

## 2017-06-17 DIAGNOSIS — R05 Cough: Secondary | ICD-10-CM | POA: Diagnosis present

## 2017-06-17 DIAGNOSIS — R059 Cough, unspecified: Secondary | ICD-10-CM

## 2017-06-17 MED ORDER — ALBUTEROL SULFATE HFA 108 (90 BASE) MCG/ACT IN AERS
2.0000 | INHALATION_SPRAY | Freq: Once | RESPIRATORY_TRACT | Status: AC
  Administered 2017-06-17: 2 via RESPIRATORY_TRACT
  Filled 2017-06-17: qty 6.7

## 2017-06-17 MED ORDER — PREDNISONE 50 MG PO TABS
60.0000 mg | ORAL_TABLET | Freq: Once | ORAL | Status: AC
  Administered 2017-06-17: 10:00:00 60 mg via ORAL
  Filled 2017-06-17: qty 1

## 2017-06-17 MED ORDER — PREDNISONE 20 MG PO TABS: ORAL_TABLET | ORAL | 0 refills | Status: DC

## 2017-06-17 MED ORDER — DOXYCYCLINE HYCLATE 100 MG PO CAPS: 100.0000 mg | ORAL_CAPSULE | Freq: Two times a day (BID) | ORAL | 0 refills | Status: DC

## 2017-06-17 MED FILL — predniSONE 20 MG TABS: 20 | 4 days supply | Qty: 8 | Fill #0

## 2017-06-17 MED FILL — DOXYCYCLINE HYCLATE 100 MG: 100 | 7 days supply | Qty: 14 | Fill #0

## 2017-06-17 NOTE — ED Triage Notes (Signed)
Pt c/o cough and pain on inspiration x3 days, went to UC and given antibotics with no relief

## 2017-06-17 NOTE — ED Provider Notes (Signed)
[facility] EMERGENCY DEPARTMENT Provider Note   CSN: [id number] Arrival date & time: 06/17/17  0830     History   Chief Complaint Chief Complaint  Patient presents with  . URI    HPI Monica Sloan is a 32 y.o. female.   Cough  This is a new problem. The current episode started more than 2 days ago. The problem occurs constantly. The problem has been gradually worsening. The cough is productive of sputum. There has been no fever. Associated symptoms include shortness of breath and wheezing. Pertinent negatives include no chest pain.    Past Medical History:  Diagnosis Date  . Asthma   . Seasonal allergies   . Sickle cell trait (HCC)   . SVD (spontaneous vaginal delivery) 09/06/2016  . Thyroid disease     Patient Active Problem List   Diagnosis Date Noted  . SVD (spontaneous vaginal delivery) 09/06/2016  . Indication for care in labor or delivery 07/07/2013  . Multinodular goiter 10/01/2012  . Asthma, intermittent controlled 04/17/2011    Past Surgical History:  Procedure Laterality Date  . DILATION AND CURETTAGE OF UTERUS  2009   x's 2      OB History    Gravida  5   Para  3   Term  3   Preterm  0   AB  2   Living  3     SAB  2   TAB  0   Ectopic  0   Multiple  0   Live Births  3            Home Medications    Prior to Admission medications   Medication Sig Start Date End Date Taking? Authorizing Provider  doxycycline (VIBRAMYCIN) 100 MG capsule Take 1 capsule (100 mg total) by mouth 2 (two) times daily. One po bid x 7 days 06/18/17   Davell Beckstead, Barbara Cower, MD  ibuprofen (ADVIL,MOTRIN) 800 MG tablet Take 1 tablet (800 mg total) by mouth every 8 (eight) hours as needed. 09/08/16   Bovard-Stuckert, Augusto Gamble, MD  predniSONE (DELTASONE) 20 MG tablet 2 tabs po daily x 4 days 06/17/17   Jarret Torre, Barbara Cower, MD  Prenatal Vit-Fe Fumarate-FA (PRENATAL MULTIVITAMIN) TABS tablet Take 1 tablet by mouth daily at 12 noon. 09/08/16   Bovard-StuckertAugusto Gamble, MD      Family History Family History  Problem Relation Age of Onset  . Arthritis Mother   . Ovarian cancer Mother   . Heart murmur Mother   . Hypertension Mother   . Arthritis Father   . Hyperlipidemia Father   . Diabetes Father   . Emotional abuse Father        Depression  . Colon cancer Unknown        Maternal Second Cousin  . Breast cancer Unknown        Maternal second cousin  . Prostate cancer Paternal Grandfather   . Diabetes Paternal Grandfather   . Lung cancer Unknown        Maternal Great Uncle  . Heart disease Cousin   . Hypertension Maternal Grandmother   . Kidney disease Maternal Grandmother     Social History Social History   Tobacco Use  . Smoking status: Current Some Day Smoker    Types: Cigarettes  . Smokeless tobacco: Never Used  Substance Use Topics  . Alcohol use: No  . Drug use: No     Allergies   Chocolate; Peanuts [peanut oil]; and Wine [alcohol]   Review of Systems  Review of Systems  Respiratory: Positive for cough, shortness of breath and wheezing.   Cardiovascular: Negative for chest pain.  All other systems reviewed and are negative.    Physical Exam Updated Vital Signs BP 129/84 (BP Location: Right Arm)   Pulse 84   Temp 99 F (37.2 C) (Oral)   Resp 18   LMP 06/10/2017   SpO2 99%   Physical Exam  Constitutional: She appears well-developed and well-nourished.  HENT:  Head: Normocephalic and atraumatic.  Eyes: Conjunctivae and EOM are normal.  Neck: Normal range of motion.  Cardiovascular: Normal rate and regular rhythm.  Pulmonary/Chest: Effort normal. No stridor. No respiratory distress. She has decreased breath sounds. She has wheezes.  Abdominal: Soft. Bowel sounds are normal. She exhibits no distension.  Neurological: She is alert.  Skin: Skin is warm and dry.  Nursing note and vitals reviewed.    ED Treatments / Results  Labs (all labs ordered are listed, but only abnormal results are displayed) Labs Reviewed  - No data to display  EKG None  Radiology Dg Chest 2 View  Result Date: 06/17/2017 CLINICAL DATA:  Cough with increasing shortness of breath. EXAM: CHEST - 2 VIEW COMPARISON:  11/10/2013 FINDINGS: Cardiomediastinal silhouette is normal. Mediastinal contours appear intact. There is no evidence of pleural effusion or pneumothorax. Subtle peribronchial airspace consolidation in the right lower lobe. Osseous structures are without acute abnormality. Soft tissues are grossly normal. IMPRESSION: Subtle peribronchial airspace consolidation in the right lower lobe may represent bronchitis or early bronchopneumonia. Follow-up to resolution after empiric treatment is recommended. Electronically Signed   By: Ted Mcalpine M.D.   On: 06/17/2017 09:16    Procedures Procedures (including critical care time)  Medications Ordered in ED Medications  predniSONE (DELTASONE) tablet 60 mg (has no administration in time range)  albuterol (PROVENTIL HFA;VENTOLIN HFA) 108 (90 Base) MCG/ACT inhaler 2 puff (has no administration in time range)     Initial Impression / Assessment and Plan / ED Course  I have reviewed the triage vital signs and the nursing notes.  Pertinent labs & imaging results that were available during my care of the patient were reviewed by me and considered in my medical decision making (see chart for details).   Likely pneumonia.  On her third day of antibiotics takes her fourth dose tonight.  I would suggest finishing the course of azithromycin and if not improving then switching over to the new antibiotic.  We will also give her a prescription for steroids to help as well and an inhaler as she does have some diminished breath sounds and wheezing.  Final Clinical Impressions(s) / ED Diagnoses   Final diagnoses:  Cough  Community acquired pneumonia, unspecified laterality    ED Discharge Orders        Ordered    doxycycline (VIBRAMYCIN) 100 MG capsule  2 times daily      06/17/17 0955    predniSONE (DELTASONE) 20 MG tablet     06/17/17 0956       Marlys Stegmaier, Barbara Cower, MD 06/17/17 [phone]

## 2017-06-22 ENCOUNTER — Other Ambulatory Visit: Payer: Self-pay

## 2017-06-22 ENCOUNTER — Encounter (HOSPITAL_BASED_OUTPATIENT_CLINIC_OR_DEPARTMENT_OTHER): Payer: Self-pay | Admitting: Emergency Medicine

## 2017-06-22 ENCOUNTER — Emergency Department (HOSPITAL_BASED_OUTPATIENT_CLINIC_OR_DEPARTMENT_OTHER)
Admission: EM | Admit: 2017-06-22 | Discharge: 2017-06-22 | Disposition: A | Discharge status: Home / Self Care | Payer: Medicaid Other | Attending: Emergency Medicine | Admitting: Emergency Medicine

## 2017-06-22 ENCOUNTER — Emergency Department (HOSPITAL_BASED_OUTPATIENT_CLINIC_OR_DEPARTMENT_OTHER): Payer: Medicaid Other

## 2017-06-22 DIAGNOSIS — J40 Bronchitis, not specified as acute or chronic: Secondary | ICD-10-CM | POA: Diagnosis not present

## 2017-06-22 DIAGNOSIS — Z79899 Other long term (current) drug therapy: Secondary | ICD-10-CM | POA: Diagnosis not present

## 2017-06-22 DIAGNOSIS — F17210 Nicotine dependence, cigarettes, uncomplicated: Secondary | ICD-10-CM | POA: Insufficient documentation

## 2017-06-22 DIAGNOSIS — R05 Cough: Secondary | ICD-10-CM | POA: Diagnosis present

## 2017-06-22 DIAGNOSIS — Z91010 Allergy to peanuts: Secondary | ICD-10-CM | POA: Diagnosis not present

## 2017-06-22 MED ORDER — METHYLPREDNISOLONE 4 MG PO TBPK: ORAL_TABLET | ORAL | 0 refills | Status: DC

## 2017-06-22 MED ORDER — BENZONATATE 100 MG PO CAPS: 100.0000 mg | ORAL_CAPSULE | Freq: Three times a day (TID) | ORAL | 0 refills | Status: DC | PRN

## 2017-06-22 MED ORDER — PREDNISONE 10 MG PO TABS
60.0000 mg | ORAL_TABLET | Freq: Once | ORAL | Status: AC
  Administered 2017-06-22: 60 mg via ORAL
  Filled 2017-06-22: qty 1

## 2017-06-22 MED ORDER — IPRATROPIUM BROMIDE 0.02 % IN SOLN
0.5000 mg | Freq: Once | RESPIRATORY_TRACT | Status: AC
  Administered 2017-06-22: 0.5 mg via RESPIRATORY_TRACT
  Filled 2017-06-22: qty 2.5

## 2017-06-22 MED ORDER — BENZONATATE 100 MG PO CAPS
100.0000 mg | ORAL_CAPSULE | Freq: Once | ORAL | Status: AC
  Administered 2017-06-22: 100 mg via ORAL
  Filled 2017-06-22: qty 1

## 2017-06-22 MED ORDER — ALBUTEROL SULFATE (2.5 MG/3ML) 0.083% IN NEBU
5.0000 mg | INHALATION_SOLUTION | Freq: Once | RESPIRATORY_TRACT | Status: AC
  Administered 2017-06-22: 5 mg via RESPIRATORY_TRACT
  Filled 2017-06-22: qty 6

## 2017-06-22 NOTE — Discharge Instructions (Addendum)
Take medrol dose pack as prescribed   Take cough medicine as needed.   Continue albuterol as needed for cough.   See your doctor  Return to ER if you have worse cough, congestion, fever.

## 2017-06-22 NOTE — ED Provider Notes (Signed)
[facility] EMERGENCY DEPARTMENT Provider Note   CSN: [id number] Arrival date & time: 06/22/17  2042     History   Chief Complaint No chief complaint on file.   HPI Monica Sloan is a 32 y.o. female history of asthma, here presenting with persistent cough.  Patient was seen in the ED about a week ago and was diagnosed with pneumonia and was started on doxycycline and steroid taper.  Patient states that she finished her prednisone but she is still taking her doxycycline.  She states that she has persistent cough and congestion.  Denies any fevers but states that she has some chills. Denies any sick contacts. Used albuterol with minimal relief.   The history is provided by the patient.    Past Medical History:  Diagnosis Date  . Asthma   . Seasonal allergies   . Sickle cell trait (HCC)   . SVD (spontaneous vaginal delivery) 09/06/2016  . Thyroid disease     Patient Active Problem List   Diagnosis Date Noted  . SVD (spontaneous vaginal delivery) 09/06/2016  . Indication for care in labor or delivery 07/07/2013  . Multinodular goiter 10/01/2012  . Asthma, intermittent controlled 04/17/2011    Past Surgical History:  Procedure Laterality Date  . DILATION AND CURETTAGE OF UTERUS  2009   x's 2      OB History    Gravida  5   Para  3   Term  3   Preterm  0   AB  2   Living  3     SAB  2   TAB  0   Ectopic  0   Multiple  0   Live Births  3            Home Medications    Prior to Admission medications   Medication Sig Start Date End Date Taking? Authorizing Provider  doxycycline (VIBRAMYCIN) 100 MG capsule Take 1 capsule (100 mg total) by mouth 2 (two) times daily. One po bid x 7 days 06/18/17   Mesner, Barbara Cower, MD  ibuprofen (ADVIL,MOTRIN) 800 MG tablet Take 1 tablet (800 mg total) by mouth every 8 (eight) hours as needed. 09/08/16   Bovard-Stuckert, Augusto Gamble, MD  predniSONE (DELTASONE) 20 MG tablet 2 tabs po daily x 4 days 06/17/17   Mesner,  Barbara Cower, MD  Prenatal Vit-Fe Fumarate-FA (PRENATAL MULTIVITAMIN) TABS tablet Take 1 tablet by mouth daily at 12 noon. 09/08/16   Bovard-StuckertAugusto Gamble, MD    Family History Family History  Problem Relation Age of Onset  . Arthritis Mother   . Ovarian cancer Mother   . Heart murmur Mother   . Hypertension Mother   . Arthritis Father   . Hyperlipidemia Father   . Diabetes Father   . Emotional abuse Father        Depression  . Colon cancer Unknown        Maternal Second Cousin  . Breast cancer Unknown        Maternal second cousin  . Prostate cancer Paternal Grandfather   . Diabetes Paternal Grandfather   . Lung cancer Unknown        Maternal Great Uncle  . Heart disease Cousin   . Hypertension Maternal Grandmother   . Kidney disease Maternal Grandmother     Social History Social History   Tobacco Use  . Smoking status: Current Some Day Smoker    Types: Cigarettes  . Smokeless tobacco: Never Used  Substance Use Topics  .  Alcohol use: No  . Drug use: No     Allergies   Chocolate; Peanuts [peanut oil]; and Wine [alcohol]   Review of Systems Review of Systems  Respiratory: Positive for cough.   All other systems reviewed and are negative.    Physical Exam Updated Vital Signs BP 117/69 (BP Location: Left Arm)   Pulse 85   Temp 99.3 F (37.4 C) (Oral)   Resp 20   Ht  (1.549 m)   Wt 85.7 kg (189 lb)   LMP 06/22/2017   SpO2 100%   BMI 35.71 kg/m   Physical Exam  Constitutional: She is oriented to person, place, and time. She appears well-developed and well-nourished.  Mild cough   HENT:  Head: Normocephalic.  Mouth/Throat: Oropharynx is clear and moist.  Eyes: Pupils are equal, round, and reactive to light. Conjunctivae and EOM are normal.  Neck: Normal range of motion. Neck supple.  Cardiovascular: Normal rate, regular rhythm and normal heart sounds.  Pulmonary/Chest:  Slightly tachypneic, mild diffuse wheezing, no retractions   Abdominal: Soft.  Bowel sounds are normal. She exhibits no distension. There is no tenderness. There is no guarding.  Musculoskeletal: Normal range of motion. She exhibits no edema, tenderness or deformity.  Neurological: She is alert and oriented to person, place, and time.  Skin: Skin is warm.  Psychiatric: She has a normal mood and affect.  Nursing note and vitals reviewed.    ED Treatments / Results  Labs (all labs ordered are listed, but only abnormal results are displayed) Labs Reviewed - No data to display  EKG None  Radiology Dg Chest 2 View  Result Date: 06/22/2017 CLINICAL DATA:  Cough and congestion. EXAM: CHEST - 2 VIEW COMPARISON:  06/17/2017 and prior radiographs FINDINGS: The cardiomediastinal silhouette is unremarkable. Peribronchial thickening again noted. There is no evidence of focal airspace disease, pulmonary edema, suspicious pulmonary nodule/mass, pleural effusion, or pneumothorax. No acute bony abnormalities are identified. IMPRESSION: Peribronchial thickening without focal pneumonia. Electronically Signed   By: Harmon Pier M.D.   On: 06/22/2017 21:33    Procedures Procedures (including critical care time)  Medications Ordered in ED Medications  albuterol (PROVENTIL) (2.5 MG/3ML) 0.083% nebulizer solution 5 mg (5 mg Nebulization Given 06/22/17 2115)  ipratropium (ATROVENT) nebulizer solution 0.5 mg (0.5 mg Nebulization Given 06/22/17 2115)  predniSONE (DELTASONE) tablet 60 mg (60 mg Oral Given 06/22/17 2129)     Initial Impression / Assessment and Plan / ED Course  I have reviewed the triage vital signs and the nursing notes.  Pertinent labs & imaging results that were available during my care of the patient were reviewed by me and considered in my medical decision making (see chart for details).     Monica Sloan is a 32 y.o. female here with cough. Consider persistent bronchitis vs residual cough after pneumonia vs recurrent pneumonia. Afebrile, well appearing. Will  repeat CXR. Will give nebs, steroids   10:22 PM CXR clear. Will dc home with another course of steroids, cough medicine.    Final Clinical Impressions(s) / ED Diagnoses   Final diagnoses:  None    ED Discharge Orders    None       Charlynne Pander, MD 06/22/17 2223

## 2017-06-22 NOTE — ED Triage Notes (Addendum)
Patient states that " I feel like I have bronchitis" - the patient reports that she has been coughing. The patient states that she had pneumonia about a week ago and now she continues to have a constant cough. Patient is continually and loudly attempting to clear throat while in triage

## 2018-01-25 IMAGING — DX DG HAND COMPLETE 3+V*R*
3 series · 3 of 3 positions shown · non-contrast
Comparison: None.

CLINICAL DATA: Right hand pain and swelling after punching wall
yesterday.

EXAM:
RIGHT HAND - COMPLETE 3+ VIEW

[hand pa]
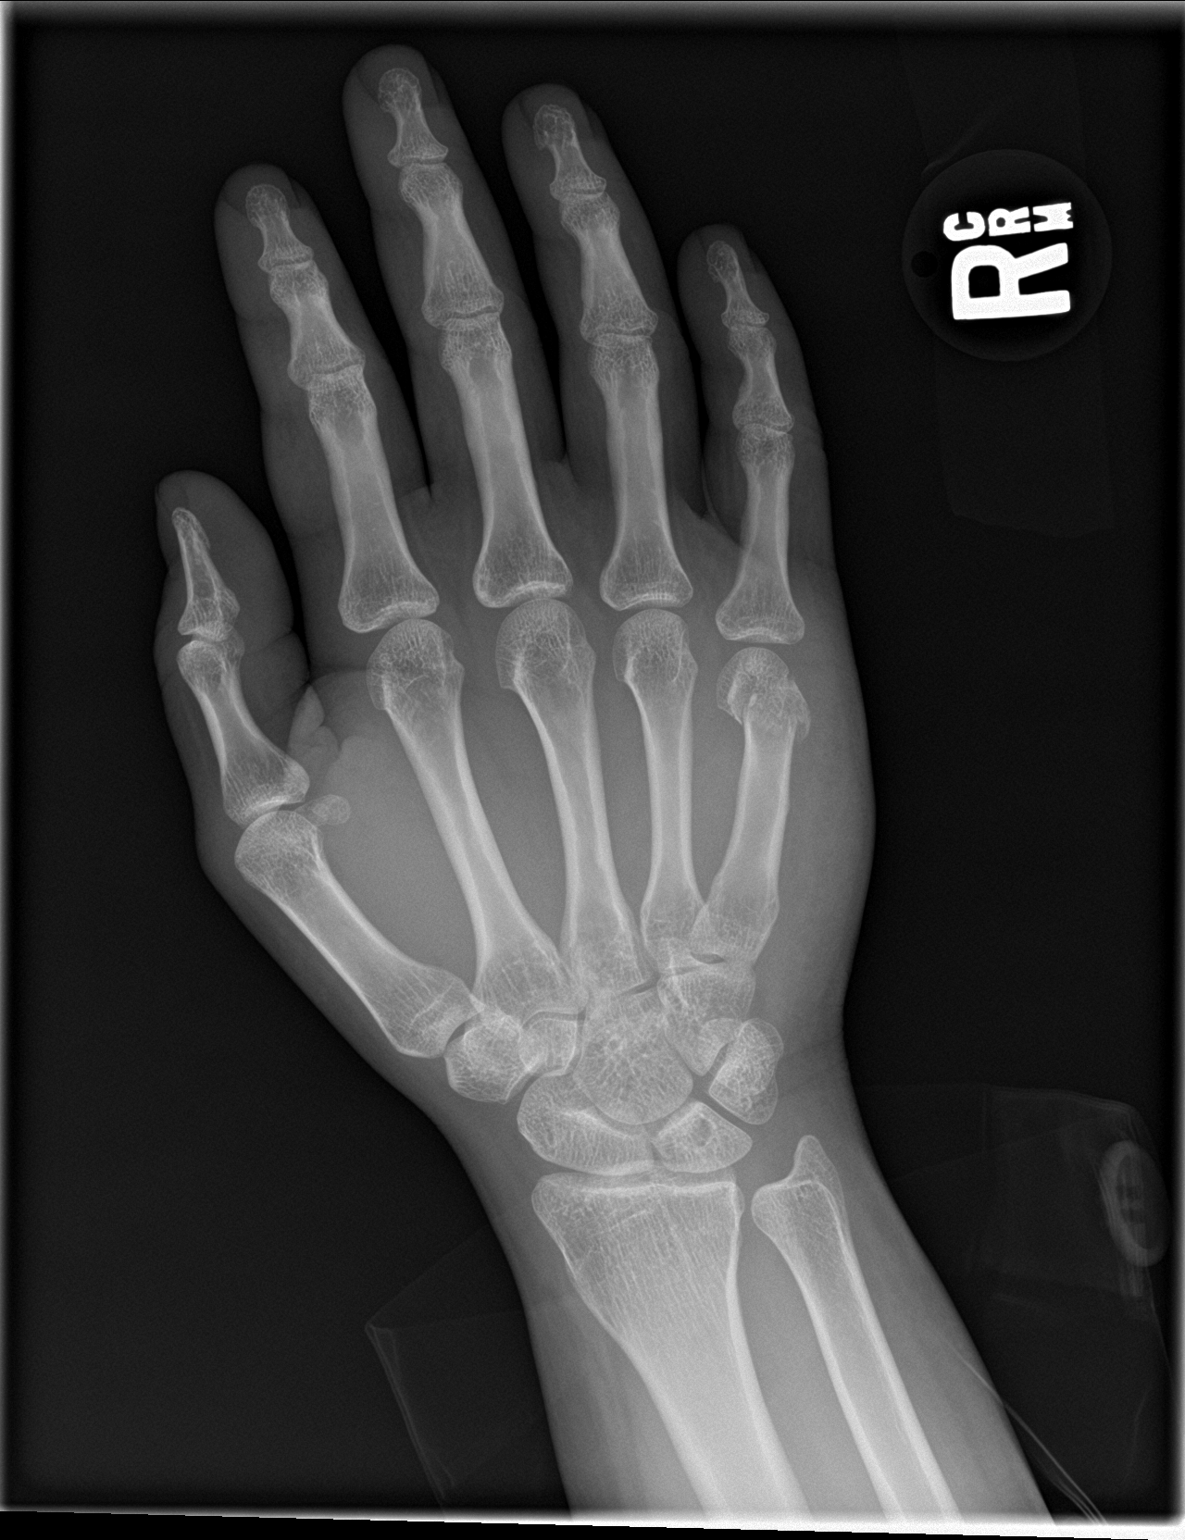

[hand obl]
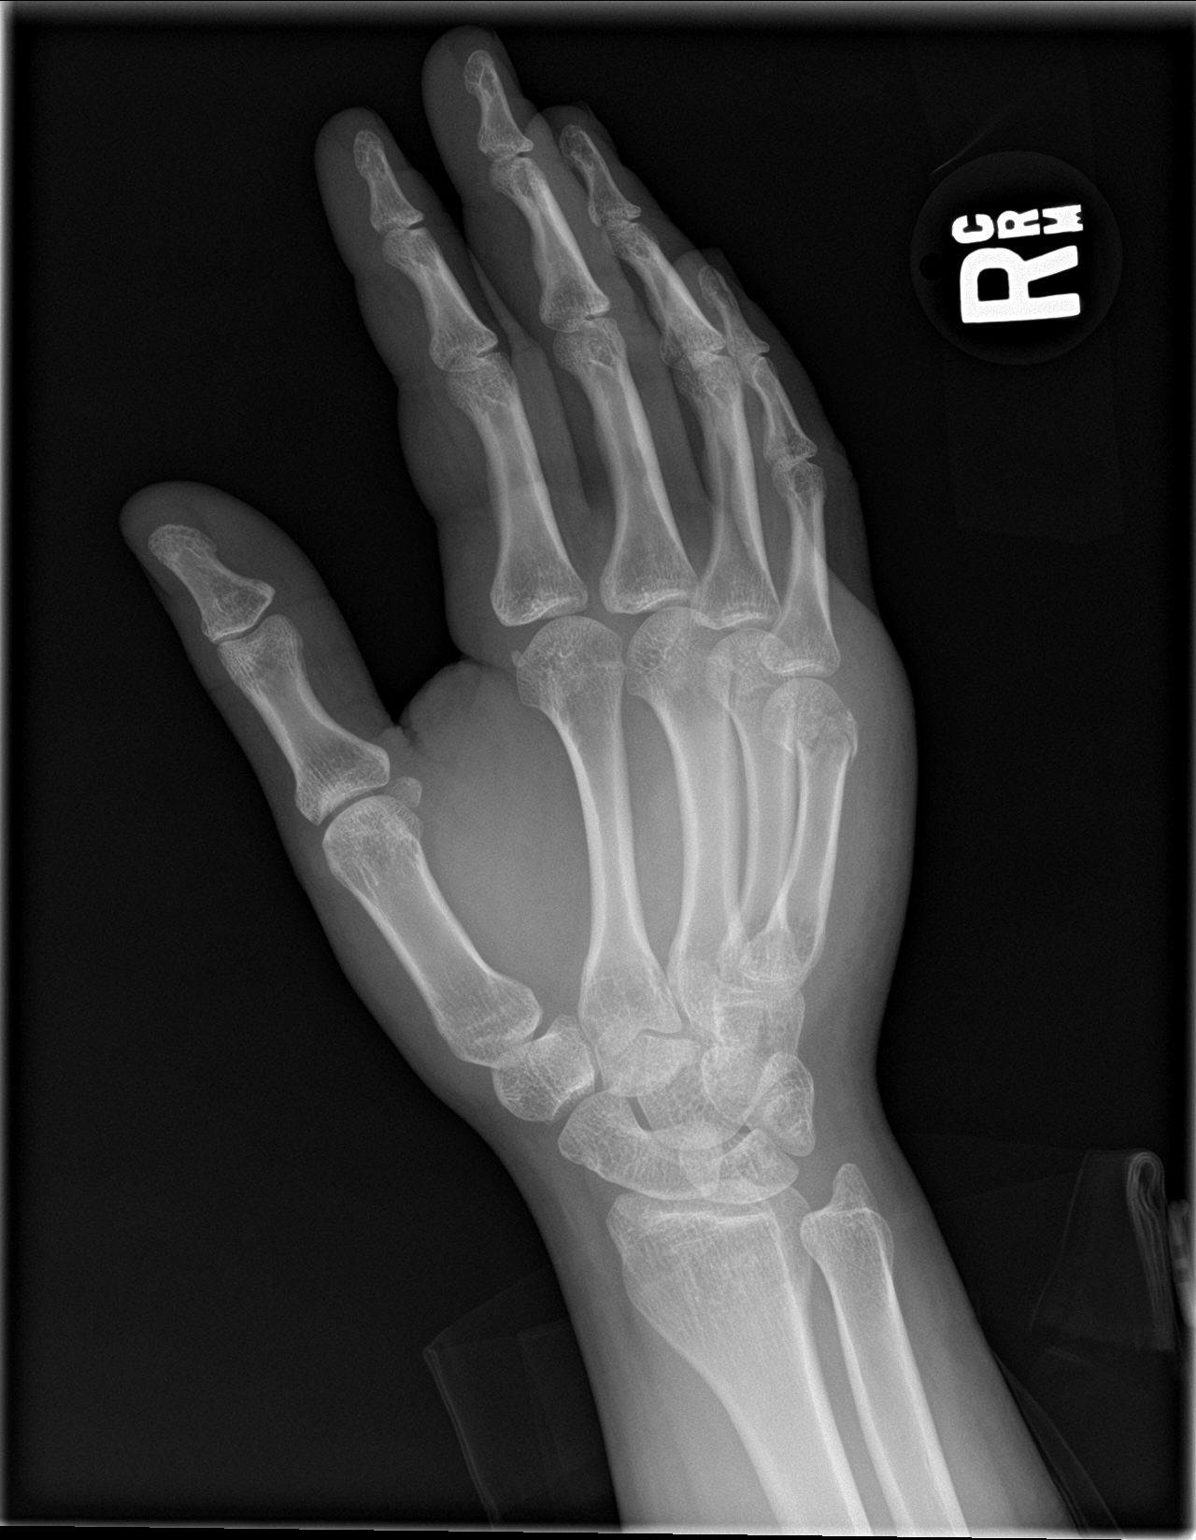

[hand lat]
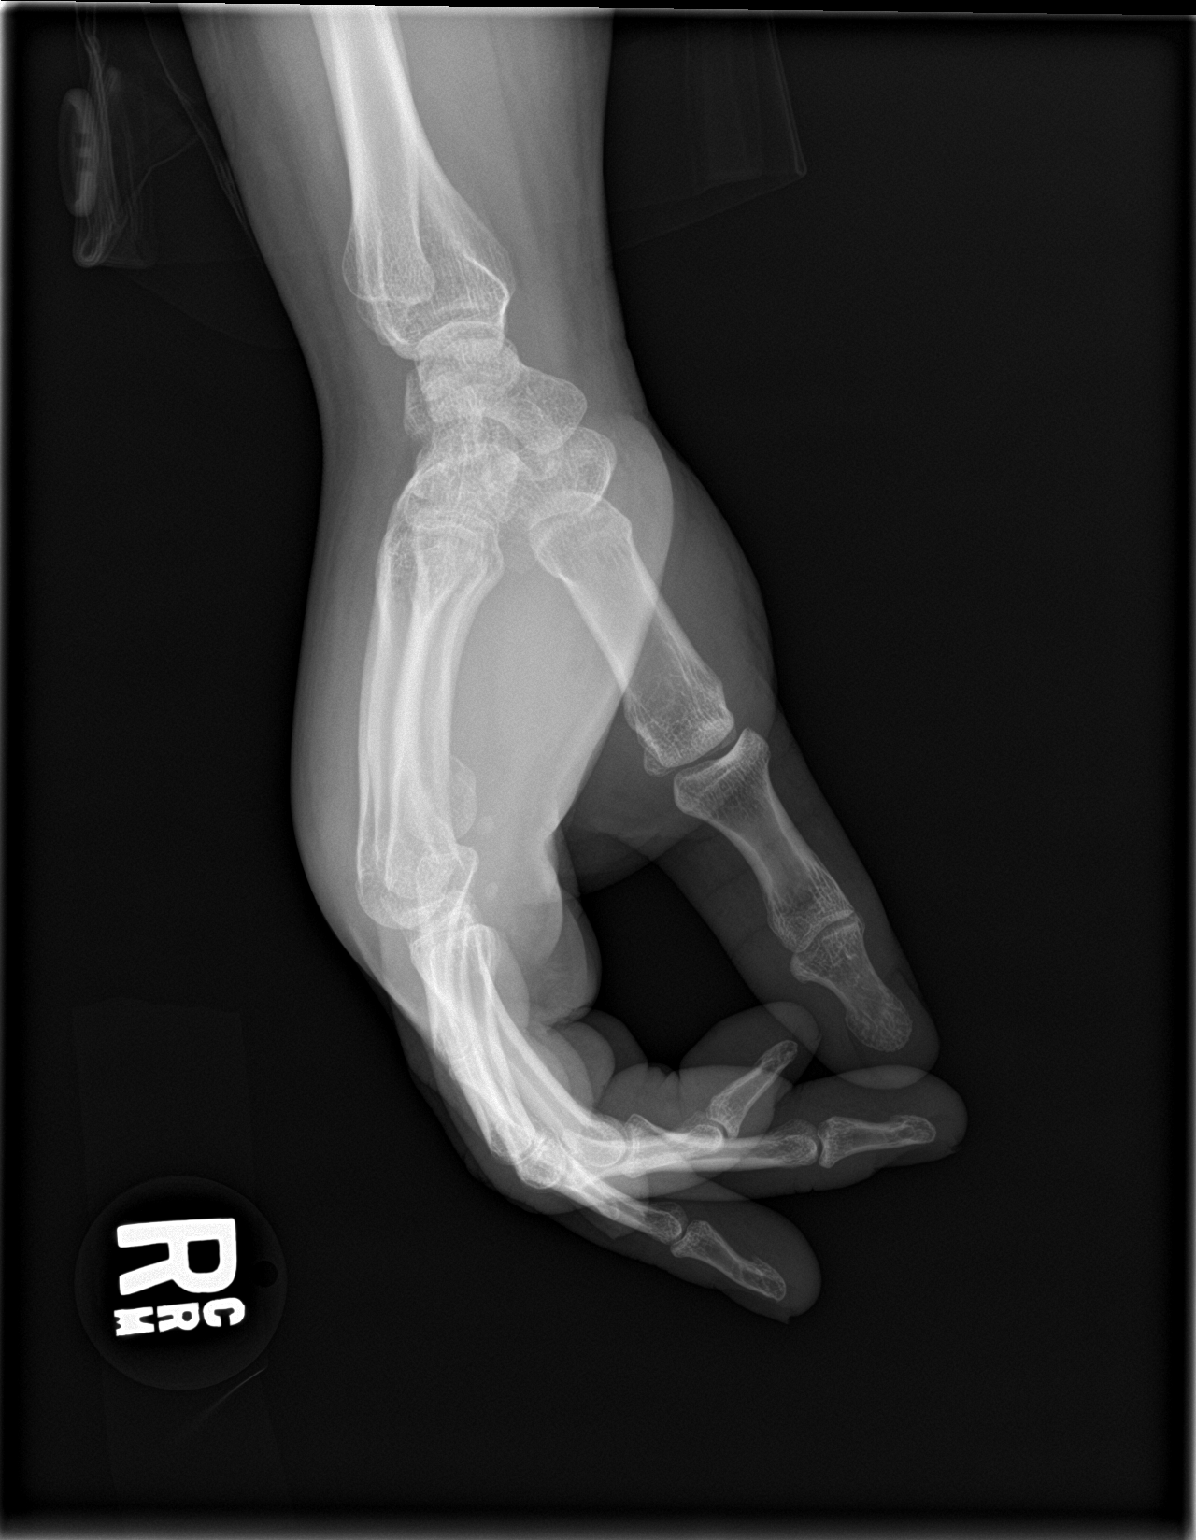

[3 of 3 positions shown; findings below may reference images not displayed]

FINDINGS: Moderately displaced and comminuted fracture is seen involving the
distal fifth metacarpal. No other fracture or bony abnormality is
noted. Joint spaces are intact. No soft tissue abnormality is noted.
IMPRESSION: Moderately displaced and comminuted distal fifth metacarpal
fracture.

## 2018-02-10 ENCOUNTER — Encounter (HOSPITAL_COMMUNITY): Payer: Self-pay | Admitting: Emergency Medicine

## 2018-02-10 ENCOUNTER — Emergency Department (HOSPITAL_COMMUNITY)
Admission: EM | Admit: 2018-02-10 | Discharge: 2018-02-10 | Discharge status: Against Medical Advice | Payer: Medicaid Other | Attending: Emergency Medicine | Admitting: Emergency Medicine

## 2018-02-10 DIAGNOSIS — M542 Cervicalgia: Secondary | ICD-10-CM | POA: Diagnosis present

## 2018-02-10 DIAGNOSIS — M549 Dorsalgia, unspecified: Secondary | ICD-10-CM | POA: Insufficient documentation

## 2018-02-10 DIAGNOSIS — Z5321 Procedure and treatment not carried out due to patient leaving prior to being seen by health care provider: Secondary | ICD-10-CM | POA: Diagnosis not present
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
## 2018-02-11 ENCOUNTER — Encounter (HOSPITAL_BASED_OUTPATIENT_CLINIC_OR_DEPARTMENT_OTHER): Payer: Self-pay | Admitting: Emergency Medicine

## 2018-02-11 ENCOUNTER — Emergency Department (HOSPITAL_BASED_OUTPATIENT_CLINIC_OR_DEPARTMENT_OTHER): Payer: Medicaid Other

## 2018-02-11 ENCOUNTER — Emergency Department (HOSPITAL_BASED_OUTPATIENT_CLINIC_OR_DEPARTMENT_OTHER)
Admission: EM | Admit: 2018-02-11 | Discharge: 2018-02-11 | Disposition: A | Discharge status: Home / Self Care | Payer: Medicaid Other | Attending: Emergency Medicine | Admitting: Emergency Medicine

## 2018-02-11 ENCOUNTER — Other Ambulatory Visit: Payer: Self-pay

## 2018-02-11 DIAGNOSIS — Y939 Activity, unspecified: Secondary | ICD-10-CM | POA: Insufficient documentation

## 2018-02-11 DIAGNOSIS — Y999 Unspecified external cause status: Secondary | ICD-10-CM | POA: Diagnosis not present

## 2018-02-11 DIAGNOSIS — Z91010 Allergy to peanuts: Secondary | ICD-10-CM | POA: Diagnosis not present

## 2018-02-11 DIAGNOSIS — S161XXA Strain of muscle, fascia and tendon at neck level, initial encounter: Secondary | ICD-10-CM | POA: Diagnosis not present

## 2018-02-11 DIAGNOSIS — Z79899 Other long term (current) drug therapy: Secondary | ICD-10-CM | POA: Insufficient documentation

## 2018-02-11 DIAGNOSIS — J45909 Unspecified asthma, uncomplicated: Secondary | ICD-10-CM | POA: Diagnosis not present

## 2018-02-11 DIAGNOSIS — S169XXA Unspecified injury of muscle, fascia and tendon at neck level, initial encounter: Secondary | ICD-10-CM | POA: Diagnosis present

## 2018-02-11 DIAGNOSIS — Y92410 Unspecified street and highway as the place of occurrence of the external cause: Secondary | ICD-10-CM | POA: Insufficient documentation

## 2018-02-11 DIAGNOSIS — M545 Low back pain, unspecified: Secondary | ICD-10-CM

## 2018-02-11 DIAGNOSIS — F17210 Nicotine dependence, cigarettes, uncomplicated: Secondary | ICD-10-CM | POA: Diagnosis not present

## 2018-02-11 MED ORDER — CYCLOBENZAPRINE HCL 10 MG PO TABS: 10.0000 mg | ORAL_TABLET | Freq: Two times a day (BID) | ORAL | 0 refills | Status: DC | PRN

## 2018-02-11 MED ORDER — NAPROXEN 500 MG PO TABS: 500.0000 mg | ORAL_TABLET | Freq: Two times a day (BID) | ORAL | 0 refills | Status: AC

## 2018-02-11 MED FILL — NAPROXEN 500 MG TABLET: 500 | 7 days supply | Qty: 14 | Fill #0

## 2018-02-11 MED FILL — CYCLOBENZAPRINE HCL 10 MG T: 10 | 7 days supply | Qty: 14 | Fill #0
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
## 2018-02-11 NOTE — Discharge Instructions (Addendum)
Take the medications as needed for pain, follow-up with your doctor if not improving in the next week

## 2020-05-11 ENCOUNTER — Ambulatory Visit: Payer: Self-pay | Admitting: Surgery

## 2020-05-28 ENCOUNTER — Encounter (HOSPITAL_COMMUNITY): Payer: Self-pay | Admitting: Surgery

## 2020-05-28 DIAGNOSIS — J398 Other specified diseases of upper respiratory tract: Secondary | ICD-10-CM | POA: Diagnosis present

## 2020-05-28 NOTE — H&P (Signed)
General Surgery [facility]  Monica Sloan DOB: 12-May-1985 Single / Language: [place] / Race: Black or African American Female   History of Present Illness   The patient is a 35 year old female who presents with a complaint of Enlarged thyroid.  CHIEF COMPLAINT: multinodular thyroid goiter with tracheal deviation  Patient is referred by Dr. Crista Curb for surgical evaluation and management of multinodular thyroid goiter with tracheal deviation and compressive symptoms. Patient is accompanied by her mother. She has had issues with her thyroid dating back approximately 8 years. She has never been on medication. She has had a gradual increase in size of the thyroid gland. Most prominent is a large 6 cm nodule in the right thyroid lobe. Ultrasound shows this to be a complex nodule with cystic and solid components. Fine-needle aspiration biopsy has been benign. Left thyroid lobe is slightly enlarged with multiple subcentimeter nodules. Overall thyroid function is normal with a TSH level of 0.6. Patient has developed a moderate globus sensation. She is also noted intermittent episodes of hoarseness and overall she has had some change in the quality of her voice. She does sing at church. Patient was previously evaluated for surgery but this was postponed due to the Covid pandemic. Patient is now referred to my practice for consideration for total thyroidectomy.   Past Surgical History No pertinent past surgical history   Diagnostic Studies History Colonoscopy  never Mammogram  never Pap Smear  1-5 years ago  Allergies No Known Drug Allergies  Allergies Reconciled   Medication History  Montelukast Sodium (10MG  Tablet, Oral) Active. Multivitamin (Oral) Active. Medications Reconciled  Social History  Caffeine use  Coffee. No alcohol use  No drug use  Tobacco use  Current some day smoker.  Family History  Anesthetic complications   Mother. Arthritis  Father, Mother. Colon Cancer  Family Members In General. Depression  Father. Diabetes Mellitus  Father. Heart Disease  Mother. Hypertension  Mother. Kidney Disease  Mother. Respiratory Condition  Family Members In General. Thyroid problems  Family Members In General.  Pregnancy / Birth History  Age at menarche  11 years. Gravida  5 Length (months) of breastfeeding  12-24 Maternal age  51-20 Para  3 Regular periods   Other Problems  Asthma  Gastroesophageal Reflux Disease  Thyroid Disease   Review of Systems  General Not Present- Appetite Loss, Chills, Fatigue, Fever, Night Sweats, Weight Gain and Weight Loss. Skin Not Present- Change in Wart/Mole, Dryness, Hives, Jaundice, New Lesions, Non-Healing Wounds, Rash and Ulcer. HEENT Not Present- Earache, Hearing Loss, Hoarseness, Nose Bleed, Oral Ulcers, Ringing in the Ears, Seasonal Allergies, Sinus Pain, Sore Throat, Visual Disturbances, Wears glasses/contact lenses and Yellow Eyes. Respiratory Not Present- Bloody sputum, Chronic Cough, Difficulty Breathing, Snoring and Wheezing. Breast Not Present- Breast Mass, Breast Pain, Nipple Discharge and Skin Changes. Cardiovascular Not Present- Chest Pain, Difficulty Breathing Lying Down, Leg Cramps, Palpitations, Rapid Heart Rate, Shortness of Breath and Swelling of Extremities. Gastrointestinal Not Present- Abdominal Pain, Bloating, Bloody Stool, Change in Bowel Habits, Chronic diarrhea, Constipation, Difficulty Swallowing, Excessive gas, Gets full quickly at meals, Hemorrhoids, Indigestion, Nausea, Rectal Pain and Vomiting. Female Genitourinary Not Present- Frequency, Nocturia, Painful Urination, Pelvic Pain and Urgency. Musculoskeletal Not Present- Back Pain, Joint Pain, Joint Stiffness, Muscle Pain, Muscle Weakness and Swelling of Extremities. Neurological Not Present- Decreased Memory, Fainting, Headaches, Numbness, Seizures, Tingling, Tremor, Trouble  walking and Weakness. Psychiatric Not Present- Anxiety, Bipolar, Change in Sleep Pattern, Depression, Fearful and Frequent crying.  Endocrine Present- Excessive Hunger, Hair Changes, Heat Intolerance and Hot flashes. Not Present- Cold Intolerance and New Diabetes. Hematology Not Present- Blood Thinners, Easy Bruising, Excessive bleeding, Gland problems, HIV and Persistent Infections.  Vitals Tresa Endo Dockery LPN; 1/61/0960 45:40 AM) 05/11/2020 10:39 AM Weight: 198 lb Height: 61in Body Surface Area: 1.88 m Body Mass Index: 37.41 kg/m  Pulse: 90 (Regular)  BP: 118/66(Sitting, Right Arm, Standard)   Physical Exam   GENERAL APPEARANCE Development: normal Nutritional status: normal Gross deformities: none  SKIN Rash, lesions, ulcers: none Induration, erythema: none Nodules: none palpable  EYES Conjunctiva and lids: normal Pupils: equal and reactive Iris: normal bilaterally  EARS, NOSE, MOUTH, THROAT External ears: no lesion or deformity External nose: no lesion or deformity Hearing: grossly normal Due to Covid-19 pandemic, patient is wearing a mask.  NECK Symmetric: no Trachea: shifted to left Thyroid: The right thyroid lobe was largely replaced by a dominant soft lobular mass measuring at least 8 cm in total dimension and extending beneath the right clavicle. Isthmus is somewhat thickened. Trachea is deviated towards the left. Left thyroid lobe is mildly enlarged and slightly globular consistent with underlying nodules. There is no tenderness. There is no associated lymphadenopathy. Voice has a slight hoarseness to it during normal conversation.  CHEST Respiratory effort: normal Retraction or accessory muscle use: no Breath sounds: normal bilaterally Rales, rhonchi, wheeze: none  CARDIOVASCULAR Auscultation: regular rhythm, normal rate Murmurs: none Pulses: radial pulse 2+ palpable Lower extremity edema: none  MUSCULOSKELETAL Station and gait: normal Digits  and nails: no clubbing or cyanosis Muscle strength: grossly normal all extremities Range of motion: grossly normal all extremities Deformity: none  LYMPHATIC Cervical: none palpable Supraclavicular: none palpable  PSYCHIATRIC Oriented to person, place, and time: yes Mood and affect: normal for situation Judgment and insight: appropriate for situation    Assessment & Plan   MULTIPLE THYROID NODULES (E04.2) 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 Crista Curb, for surgical management of multinodular thyroid goiter with tracheal deviation and compressive symptoms.  Patient provided with a copy of "The Thyroid Book: Medical and Surgical Treatment of Thyroid Problems", published by [employer], 16 pages. Book reviewed and explained to patient during visit today.  Patient is accompanied by her mother. We reviewed her ultrasound study and her laboratory studies as well as records from the office of her endocrinologist. We discussed options for management including thyroid lobectomy versus total thyroidectomy. Given the fact that there are nodules bilaterally, I have recommended total thyroidectomy. We have discussed the risk and benefits of the procedure. We discussed the risk of recurrent laryngeal nerve injury with permanent change in voice quality. We discussed injury to parathyroid glands. 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 We discussed the size and location of her surgical incision. We discussed the hospital stay to be anticipated. We discussed the need for lifelong thyroid hormone replacement. We discussed her postoperative recovery and return to work and activities. She understands and wishes to proceed in the near future.  The risks and benefits of the procedure have been discussed at length with the patient. The patient understands the proposed procedure,  potential alternative treatments, and the course of recovery to be expected. All of the patient's questions have been answered at this time. The patient wishes to proceed with surgery.  Darnell Level, MD [facility] Office: [phone]

## 2020-06-01 NOTE — Progress Notes (Signed)
DUE TO COVID-19 ONLY ONE VISITOR IS ALLOWED TO COME WITH YOU AND STAY IN THE WAITING ROOM ONLY DURING PRE OP AND PROCEDURE DAY OF SURGERY. THE 1 VISITOR  MAY VISIT WITH YOU AFTER SURGERY IN YOUR PRIVATE ROOM DURING VISITING HOURS ONLY!  YOU NEED TO HAVE A COVID 19 TEST ON__4/22/2022 _____ @_______ , THIS TEST MUST BE DONE BEFORE SURGERY,  COVID TESTING SITE [address] [place] , IT IS ON THE RIGHT GOING OUT [address] APPROXIMATELY  2 MINUTES PAST [employer] ON THE RIGHT. ONCE YOUR COVID TEST IS COMPLETED,  PLEASE BEGIN THE QUARANTINE INSTRUCTIONS AS OUTLINED IN YOUR HANDOUT.                Monica Sloan  06/01/2020   Your procedure is scheduled on:  06/06/20  Report to [facility] Main  Entrance   Report to admitting at     0530AM     Call this number if you have problems the morning of surgery [phone]    REMEMBER: NO  SOLID FOOD CANDY OR GUM AFTER MIDNIGHT. CLEAR LIQUIDS UNTIL    0430am     . NOTHING BY MOUTH EXCEPT CLEAR LIQUIDS UNTIL     0430am    . PLEASE FINISH ENSURE DRINK PER SURGEON ORDER  WHICH NEEDS TO BE COMPLETED AT   0430 am      .      CLEAR LIQUID DIET   Foods Allowed                                                                    Coffee and tea, regular and decaf                            Fruit ices (not with fruit pulp)                                      Iced Popsicles                                    Carbonated beverages, regular and diet                                    Cranberry, grape and apple juices Sports drinks like Gatorade Lightly seasoned clear broth or consume(fat free) Sugar, honey syrup ___________________________________________________________________      BRUSH YOUR TEETH MORNING OF SURGERY AND RINSE YOUR MOUTH OUT, NO CHEWING GUM CANDY OR MINTS.     Take these medicines the morning of surgery with A SIP OF WATER:   none  DO NOT TAKE ANY DIABETIC MEDICATIONS DAY OF YOUR  SURGERY                               You may not have any metal on your body including hair pins and  piercings  Do not wear jewelry, make-up, lotions, powders or perfumes, deodorant             Do not wear nail polish on your fingernails.  Do not shave  48 hours prior to surgery.              Men may shave face and neck.   Do not bring valuables to the hospital. [place].  Contacts, dentures or bridgework may not be worn into surgery.  Leave suitcase in the car. After surgery it may be brought to your room.     Patients discharged the day of surgery will not be allowed to drive home. IF YOU ARE HAVING SURGERY AND GOING HOME THE SAME DAY, YOU MUST HAVE AN ADULT TO DRIVE YOU HOME AND BE WITH YOU FOR 24 HOURS. YOU MAY GO HOME BY TAXI OR UBER OR ORTHERWISE, BUT AN ADULT MUST ACCOMPANY YOU HOME AND STAY WITH YOU FOR 24 HOURS.  Name and phone number of your driver:  Special Instructions: N/A              Please read over the following fact sheets you were given: _____________________________________________________________________  [facility] - Preparing for Surgery Before surgery, you can play an important role.  Because skin is not sterile, your skin needs to be as free of germs as possible.  You can reduce the number of germs on your skin by washing with CHG (chlorahexidine gluconate) soap before surgery.  CHG is an antiseptic cleaner which kills germs and bonds with the skin to continue killing germs even after washing. Please DO NOT use if you have an allergy to CHG or antibacterial soaps.  If your skin becomes reddened/irritated stop using the CHG and inform your nurse when you arrive at Short Stay. Do not shave (including legs and underarms) for at least 48 hours prior to the first CHG shower.  You may shave your face/neck. Please follow these instructions carefully:  1.  Shower with CHG Soap the night before surgery and the   morning of Surgery.  2.  If you choose to wash your hair, wash your hair first as usual with your  normal  shampoo.  3.  After you shampoo, rinse your hair and body thoroughly to remove the  shampoo.                           4.  Use CHG as you would any other liquid soap.  You can apply chg directly  to the skin and wash                       Gently with a scrungie or clean washcloth.  5.  Apply the CHG Soap to your body ONLY FROM THE NECK DOWN.   Do not use on face/ open                           Wound or open sores. Avoid contact with eyes, ears mouth and genitals (private parts).                       Wash face,  Genitals (private parts) with your normal soap.             6.  Wash thoroughly, paying special attention to the area where your surgery  will be performed.  7.  Thoroughly rinse your body with warm water from the neck down.  8.  DO NOT shower/wash with your normal soap after using and rinsing off  the CHG Soap.                9.  Pat yourself dry with a clean towel.            10.  Wear clean pajamas.            11.  Place clean sheets on your bed the night of your first shower and do not  sleep with pets. Day of Surgery : Do not apply any lotions/deodorants the morning of surgery.  Please wear clean clothes to the hospital/surgery center.  FAILURE TO FOLLOW THESE INSTRUCTIONS MAY RESULT IN THE CANCELLATION OF YOUR SURGERY PATIENT SIGNATURE_________________________________  NURSE SIGNATURE__________________________________  ________________________________________________________________________

## 2020-06-02 ENCOUNTER — Other Ambulatory Visit: Payer: Self-pay

## 2020-06-02 ENCOUNTER — Ambulatory Visit (HOSPITAL_COMMUNITY)
Admission: RE | Admit: 2020-06-02 | Discharge: 2020-06-02 | Disposition: A | Discharge status: Home / Self Care | Payer: Medicaid Other | Source: Ambulatory Visit | Attending: Anesthesiology | Admitting: Anesthesiology

## 2020-06-02 ENCOUNTER — Encounter (HOSPITAL_COMMUNITY)
Admission: RE | Admit: 2020-06-02 | Discharge: 2020-06-02 | Disposition: A | Discharge status: Home / Self Care | Payer: Medicaid Other | Source: Ambulatory Visit | Attending: Surgery | Admitting: Surgery

## 2020-06-02 ENCOUNTER — Encounter (HOSPITAL_COMMUNITY): Payer: Self-pay

## 2020-06-02 ENCOUNTER — Other Ambulatory Visit (HOSPITAL_COMMUNITY)
Admission: RE | Admit: 2020-06-02 | Discharge: 2020-06-02 | Disposition: A | Discharge status: Home / Self Care | Payer: Medicaid Other | Source: Ambulatory Visit | Attending: Surgery | Admitting: Surgery

## 2020-06-02 DIAGNOSIS — Z20822 Contact with and (suspected) exposure to covid-19: Secondary | ICD-10-CM | POA: Insufficient documentation

## 2020-06-02 DIAGNOSIS — Z01818 Encounter for other preprocedural examination: Secondary | ICD-10-CM | POA: Insufficient documentation

## 2020-06-02 HISTORY — DX: Headache, unspecified: R51.9

## 2020-06-02 HISTORY — DX: Prediabetes: R73.03

## 2020-06-02 HISTORY — DX: Pneumonia, unspecified organism: J18.9

## 2020-06-02 HISTORY — DX: Depression, unspecified: F32.A

## 2020-06-02 HISTORY — DX: Anxiety disorder, unspecified: F41.9

## 2020-06-02 HISTORY — DX: Family history of other specified conditions: Z84.89

## 2020-06-02 HISTORY — DX: Gastro-esophageal reflux disease without esophagitis: K21.9

## 2020-06-02 LAB — CBC
HCT: 35.7 % — ABNORMAL LOW (ref 36.0–46.0)
Hemoglobin: 12.1 g/dL (ref 12.0–15.0)
MCH: 30.6 pg (ref 26.0–34.0)
MCHC: 33.9 g/dL (ref 30.0–36.0)
MCV: 90.4 fL (ref 80.0–100.0)
Platelets: 302 10*3/uL (ref 150–400)
RBC: 3.95 MIL/uL (ref 3.87–5.11)
RDW: 13.7 % (ref 11.5–15.5)
WBC: 6.3 10*3/uL (ref 4.0–10.5)
nRBC: 0 % (ref 0.0–0.2)

## 2020-06-02 LAB — SARS CORONAVIRUS 2 (TAT 6-24 HRS): SARS Coronavirus 2: NEGATIVE

## 2020-06-02 LAB — GLUCOSE, CAPILLARY: Glucose-Capillary: 105 mg/dL — ABNORMAL HIGH (ref 70–99)

## 2020-06-02 NOTE — Progress Notes (Signed)
Anesthesia Review:  PCP: Johny Sax- [facility]  Cardiologist : Chest x-ray : EKG : Echo : Stress test: Cardiac Cath :  Activity level: can od a flight of stiars without difficulty  Sleep Study/ CPAP : no  Fasting Blood Sugar :      / Checks Blood Sugar -- times a day:   Blood Thinner/ Instructions /Last Dose: ASA / Instructions/ Last Dose :  Prediabetes on no meds  Sickle cell trait

## 2020-06-05 ENCOUNTER — Encounter (HOSPITAL_COMMUNITY): Payer: Self-pay | Admitting: Surgery

## 2020-06-06 ENCOUNTER — Encounter (HOSPITAL_COMMUNITY): Payer: Self-pay | Admitting: Surgery

## 2020-06-06 ENCOUNTER — Ambulatory Visit (HOSPITAL_COMMUNITY)
Admission: RE | Admit: 2020-06-06 | Discharge: 2020-06-07 | Disposition: A | Discharge status: Home / Self Care | Payer: Medicaid Other | Attending: Surgery | Admitting: Surgery

## 2020-06-06 ENCOUNTER — Ambulatory Visit (HOSPITAL_COMMUNITY): Payer: Medicaid Other | Admitting: Registered Nurse

## 2020-06-06 ENCOUNTER — Other Ambulatory Visit: Payer: Self-pay

## 2020-06-06 ENCOUNTER — Encounter (HOSPITAL_COMMUNITY)
Admission: RE | Disposition: A | Discharge status: Home / Self Care | Payer: Self-pay | Source: Home / Self Care | Attending: Surgery

## 2020-06-06 DIAGNOSIS — F17200 Nicotine dependence, unspecified, uncomplicated: Secondary | ICD-10-CM | POA: Diagnosis not present

## 2020-06-06 DIAGNOSIS — E042 Nontoxic multinodular goiter: Secondary | ICD-10-CM | POA: Diagnosis present

## 2020-06-06 DIAGNOSIS — J398 Other specified diseases of upper respiratory tract: Secondary | ICD-10-CM | POA: Diagnosis not present

## 2020-06-06 DIAGNOSIS — Z8349 Family history of other endocrine, nutritional and metabolic diseases: Secondary | ICD-10-CM | POA: Diagnosis not present

## 2020-06-06 HISTORY — PX: THYROIDECTOMY: SHX17

## 2020-06-06 LAB — PREGNANCY, URINE: Preg Test, Ur: NEGATIVE

## 2020-06-06 SURGERY — THYROIDECTOMY
Anesthesia: General | Site: Neck

## 2020-06-06 MED ORDER — LACTATED RINGERS IV SOLN: INTRAVENOUS | Status: DC

## 2020-06-06 MED ORDER — PROPOFOL 10 MG/ML IV BOLUS
INTRAVENOUS | Status: DC | PRN
  Administered 2020-06-06: 130 mg via INTRAVENOUS

## 2020-06-06 MED ORDER — ONDANSETRON HCL 4 MG/2ML IJ SOLN
INTRAMUSCULAR | Status: AC
  Filled 2020-06-06: qty 2

## 2020-06-06 MED ORDER — ACETAMINOPHEN 650 MG RE SUPP: 650.0000 mg | Freq: Four times a day (QID) | RECTAL | Status: DC | PRN

## 2020-06-06 MED ORDER — TRAMADOL HCL 50 MG PO TABS
50.0000 mg | ORAL_TABLET | Freq: Four times a day (QID) | ORAL | Status: DC | PRN
  Administered 2020-06-06: 50 mg via ORAL
  Filled 2020-06-06: qty 1

## 2020-06-06 MED ORDER — AMISULPRIDE (ANTIEMETIC) 5 MG/2ML IV SOLN: 10.0000 mg | Freq: Once | INTRAVENOUS | Status: DC | PRN

## 2020-06-06 MED ORDER — ORAL CARE MOUTH RINSE: 15.0000 mL | Freq: Once | OROMUCOSAL | Status: AC

## 2020-06-06 MED ORDER — EPHEDRINE SULFATE-NACL 50-0.9 MG/10ML-% IV SOSY
PREFILLED_SYRINGE | INTRAVENOUS | Status: DC | PRN
  Administered 2020-06-06: 5 mg via INTRAVENOUS
  Administered 2020-06-06: 10 mg via INTRAVENOUS

## 2020-06-06 MED ORDER — SCOPOLAMINE 1 MG/3DAYS TD PT72
MEDICATED_PATCH | TRANSDERMAL | Status: AC
  Filled 2020-06-06: qty 1

## 2020-06-06 MED ORDER — ONDANSETRON HCL 4 MG/2ML IJ SOLN
4.0000 mg | Freq: Four times a day (QID) | INTRAMUSCULAR | Status: DC | PRN
  Administered 2020-06-06: 4 mg via INTRAVENOUS
  Filled 2020-06-06: qty 2

## 2020-06-06 MED ORDER — CEFAZOLIN SODIUM-DEXTROSE 2-4 GM/100ML-% IV SOLN
2.0000 g | INTRAVENOUS | Status: AC
  Administered 2020-06-06: 2 g via INTRAVENOUS
  Filled 2020-06-06: qty 100

## 2020-06-06 MED ORDER — SCOPOLAMINE 1 MG/3DAYS TD PT72
MEDICATED_PATCH | TRANSDERMAL | Status: DC | PRN
  Administered 2020-06-06: 1 via TRANSDERMAL

## 2020-06-06 MED ORDER — OXYCODONE HCL 5 MG PO TABS
5.0000 mg | ORAL_TABLET | ORAL | Status: DC | PRN
  Administered 2020-06-06 (×2): 5 mg via ORAL
  Filled 2020-06-06 (×2): qty 1

## 2020-06-06 MED ORDER — CALCIUM CARBONATE 1250 (500 CA) MG PO TABS
2.0000 | ORAL_TABLET | Freq: Three times a day (TID) | ORAL | Status: DC
  Administered 2020-06-06 – 2020-06-07 (×2): 1000 mg via ORAL
  Filled 2020-06-06 (×2): qty 1

## 2020-06-06 MED ORDER — CHLORHEXIDINE GLUCONATE CLOTH 2 % EX PADS: 6.0000 | MEDICATED_PAD | Freq: Once | CUTANEOUS | Status: DC

## 2020-06-06 MED ORDER — ACETAMINOPHEN 10 MG/ML IV SOLN
1000.0000 mg | Freq: Once | INTRAVENOUS | Status: DC | PRN
  Administered 2020-06-06: 1000 mg via INTRAVENOUS

## 2020-06-06 MED ORDER — MIDAZOLAM HCL 2 MG/2ML IJ SOLN
INTRAMUSCULAR | Status: AC
  Filled 2020-06-06: qty 2

## 2020-06-06 MED ORDER — HYDROMORPHONE HCL 1 MG/ML IJ SOLN: 1.0000 mg | INTRAMUSCULAR | Status: DC | PRN

## 2020-06-06 MED ORDER — 0.9 % SODIUM CHLORIDE (POUR BTL) OPTIME
TOPICAL | Status: DC | PRN
  Administered 2020-06-06: 1000 mL

## 2020-06-06 MED ORDER — DEXAMETHASONE SODIUM PHOSPHATE 10 MG/ML IJ SOLN
INTRAMUSCULAR | Status: DC | PRN
  Administered 2020-06-06: 10 mg via INTRAVENOUS

## 2020-06-06 MED ORDER — ACETAMINOPHEN 160 MG/5ML PO SOLN: 325.0000 mg | Freq: Once | ORAL | Status: DC | PRN

## 2020-06-06 MED ORDER — PHENYLEPHRINE 40 MCG/ML (10ML) SYRINGE FOR IV PUSH (FOR BLOOD PRESSURE SUPPORT)
PREFILLED_SYRINGE | INTRAVENOUS | Status: AC
  Filled 2020-06-06: qty 10

## 2020-06-06 MED ORDER — FENTANYL CITRATE (PF) 100 MCG/2ML IJ SOLN
INTRAMUSCULAR | Status: AC
  Administered 2020-06-06: 50 ug via INTRAVENOUS
  Filled 2020-06-06: qty 2

## 2020-06-06 MED ORDER — EPHEDRINE 5 MG/ML INJ
INTRAVENOUS | Status: AC
  Filled 2020-06-06: qty 10

## 2020-06-06 MED ORDER — ACETAMINOPHEN 10 MG/ML IV SOLN
INTRAVENOUS | Status: AC
  Filled 2020-06-06: qty 100

## 2020-06-06 MED ORDER — PROMETHAZINE HCL 25 MG/ML IJ SOLN: 6.2500 mg | INTRAMUSCULAR | Status: DC | PRN

## 2020-06-06 MED ORDER — CHLORHEXIDINE GLUCONATE 0.12 % MT SOLN
15.0000 mL | Freq: Once | OROMUCOSAL | Status: AC
  Administered 2020-06-06: 15 mL via OROMUCOSAL

## 2020-06-06 MED ORDER — LIDOCAINE 2% (20 MG/ML) 5 ML SYRINGE
INTRAMUSCULAR | Status: AC
  Filled 2020-06-06: qty 5

## 2020-06-06 MED ORDER — ROCURONIUM BROMIDE 10 MG/ML (PF) SYRINGE
PREFILLED_SYRINGE | INTRAVENOUS | Status: AC
  Filled 2020-06-06: qty 10

## 2020-06-06 MED ORDER — SODIUM CHLORIDE 0.45 % IV SOLN: INTRAVENOUS | Status: DC

## 2020-06-06 MED ORDER — PROPOFOL 10 MG/ML IV BOLUS
INTRAVENOUS | Status: AC
  Filled 2020-06-06: qty 20

## 2020-06-06 MED ORDER — LIDOCAINE 2% (20 MG/ML) 5 ML SYRINGE
INTRAMUSCULAR | Status: DC | PRN
  Administered 2020-06-06: 80 mg via INTRAVENOUS

## 2020-06-06 MED ORDER — FENTANYL CITRATE (PF) 100 MCG/2ML IJ SOLN
INTRAMUSCULAR | Status: DC | PRN
  Administered 2020-06-06: 100 ug via INTRAVENOUS
  Administered 2020-06-06: 25 ug via INTRAVENOUS
  Administered 2020-06-06 (×2): 50 ug via INTRAVENOUS
  Administered 2020-06-06: 25 ug via INTRAVENOUS

## 2020-06-06 MED ORDER — ACETAMINOPHEN 325 MG PO TABS: 325.0000 mg | ORAL_TABLET | Freq: Once | ORAL | Status: DC | PRN

## 2020-06-06 MED ORDER — FENTANYL CITRATE (PF) 100 MCG/2ML IJ SOLN
25.0000 ug | INTRAMUSCULAR | Status: DC | PRN
  Administered 2020-06-06: 50 ug via INTRAVENOUS

## 2020-06-06 MED ORDER — PHENYLEPHRINE HCL-NACL 20-0.9 MG/250ML-% IV SOLN
INTRAVENOUS | Status: DC | PRN
  Administered 2020-06-06: 10 ug/min via INTRAVENOUS

## 2020-06-06 MED ORDER — MIDAZOLAM HCL 5 MG/5ML IJ SOLN
INTRAMUSCULAR | Status: DC | PRN
  Administered 2020-06-06: 2 mg via INTRAVENOUS

## 2020-06-06 MED ORDER — ACETAMINOPHEN 325 MG PO TABS: 650.0000 mg | ORAL_TABLET | Freq: Four times a day (QID) | ORAL | Status: DC | PRN

## 2020-06-06 MED ORDER — SUCCINYLCHOLINE CHLORIDE 200 MG/10ML IV SOSY
PREFILLED_SYRINGE | INTRAVENOUS | Status: DC | PRN
  Administered 2020-06-06: 120 mg via INTRAVENOUS

## 2020-06-06 MED ORDER — MEPERIDINE HCL 50 MG/ML IJ SOLN: 6.2500 mg | INTRAMUSCULAR | Status: DC | PRN

## 2020-06-06 MED ORDER — SUGAMMADEX SODIUM 200 MG/2ML IV SOLN
INTRAVENOUS | Status: DC | PRN
  Administered 2020-06-06: 200 mg via INTRAVENOUS

## 2020-06-06 MED ORDER — DEXAMETHASONE SODIUM PHOSPHATE 10 MG/ML IJ SOLN
INTRAMUSCULAR | Status: AC
  Filled 2020-06-06: qty 1

## 2020-06-06 MED ORDER — FENTANYL CITRATE (PF) 250 MCG/5ML IJ SOLN
INTRAMUSCULAR | Status: AC
  Filled 2020-06-06: qty 5

## 2020-06-06 MED ORDER — ONDANSETRON 4 MG PO TBDP
4.0000 mg | ORAL_TABLET | Freq: Four times a day (QID) | ORAL | Status: DC | PRN
Start: 2020-06-06 — End: 2020-06-07

## 2020-06-06 MED ORDER — ROCURONIUM BROMIDE 10 MG/ML (PF) SYRINGE
PREFILLED_SYRINGE | INTRAVENOUS | Status: DC | PRN
  Administered 2020-06-06: 50 mg via INTRAVENOUS
  Administered 2020-06-06: 10 mg via INTRAVENOUS

## 2020-06-06 MED ORDER — PHENYLEPHRINE HCL (PRESSORS) 10 MG/ML IV SOLN
INTRAVENOUS | Status: AC
  Filled 2020-06-06: qty 2

## 2020-06-06 MED ORDER — ONDANSETRON HCL 4 MG/2ML IJ SOLN
INTRAMUSCULAR | Status: DC | PRN
  Administered 2020-06-06: 4 mg via INTRAVENOUS

## 2020-06-06 MED ORDER — PHENYLEPHRINE 40 MCG/ML (10ML) SYRINGE FOR IV PUSH (FOR BLOOD PRESSURE SUPPORT)
PREFILLED_SYRINGE | INTRAVENOUS | Status: DC | PRN
  Administered 2020-06-06: 80 ug via INTRAVENOUS

## 2020-06-06 SURGICAL SUPPLY — 29 items
ATTRACTOMAT 16X20 MAGNETIC DRP (DRAPES) ×2 IMPLANT
BLADE SURG 15 STRL LF DISP TIS (BLADE) ×1 IMPLANT
BLADE SURG 15 STRL SS (BLADE) ×1
CHLORAPREP W/TINT 26 (MISCELLANEOUS) ×2 IMPLANT
CLIP VESOCCLUDE MED 6/CT (CLIP) ×14 IMPLANT
CLIP VESOCCLUDE SM WIDE 6/CT (CLIP) ×8 IMPLANT
COVER SURGICAL LIGHT HANDLE (MISCELLANEOUS) ×2 IMPLANT
COVER WAND RF STERILE (DRAPES) ×2 IMPLANT
DERMABOND ADVANCED (GAUZE/BANDAGES/DRESSINGS) ×1
DERMABOND ADVANCED .7 DNX12 (GAUZE/BANDAGES/DRESSINGS) ×1 IMPLANT
DRAPE LAPAROTOMY T 98X78 PEDS (DRAPES) ×2 IMPLANT
DRAPE UTILITY XL STRL (DRAPES) ×2 IMPLANT
ELECT PENCIL ROCKER SW 15FT (MISCELLANEOUS) ×2 IMPLANT
ELECT REM PT RETURN 15FT ADLT (MISCELLANEOUS) ×2 IMPLANT
GAUZE 4X4 16PLY RFD (DISPOSABLE) ×2 IMPLANT
GLOVE SURG ORTHO LTX SZ8 (GLOVE) ×2 IMPLANT
GOWN STRL REUS W/TWL XL LVL3 (GOWN DISPOSABLE) ×4 IMPLANT
HEMOSTAT SURGICEL 2X4 FIBR (HEMOSTASIS) ×2 IMPLANT
ILLUMINATOR WAVEGUIDE N/F (MISCELLANEOUS) ×2 IMPLANT
KIT BASIN OR (CUSTOM PROCEDURE TRAY) ×2 IMPLANT
KIT TURNOVER KIT A (KITS) ×2 IMPLANT
PACK BASIC VI WITH GOWN DISP (CUSTOM PROCEDURE TRAY) ×2 IMPLANT
SHEARS HARMONIC 9CM CVD (BLADE) ×2 IMPLANT
SUT MNCRL AB 4-0 PS2 18 (SUTURE) ×2 IMPLANT
SUT VIC AB 3-0 SH 18 (SUTURE) ×4 IMPLANT
SYR BULB IRRIG 60ML STRL (SYRINGE) ×2 IMPLANT
TOWEL OR 17X26 10 PK STRL BLUE (TOWEL DISPOSABLE) ×2 IMPLANT
TOWEL OR NON WOVEN STRL DISP B (DISPOSABLE) ×2 IMPLANT
TUBING CONNECTING 10 (TUBING) ×2 IMPLANT

## 2020-06-06 NOTE — Interval H&P Note (Signed)
History and Physical Interval Note:  06/06/2020 7:01 AM  Monica Sloan  has presented today for surgery, with the diagnosis of MULTIPLE THYROID NODULES TRACHEAL DEVIATION.  The various methods of treatment have been discussed with the patient and family. After consideration of risks, benefits and other options for treatment, the patient has consented to    Procedure(s) with comments: TOTAL THYROIDECTOMY (N/A) - 120/RM6 as a surgical intervention.    The patient's history has been reviewed, patient examined, no change in status, stable for surgery.  I have reviewed the patient's chart and labs.  Questions were answered to the patient's satisfaction.    Darnell Level, MD [facility] Office: [phone]   Darnell Level

## 2020-06-06 NOTE — Anesthesia Procedure Notes (Signed)
Procedure Name: Intubation Date/Time: 06/06/2020 7:29 AM Performed by: Victoriano Lain, CRNA Pre-anesthesia Checklist: Patient identified, Emergency Drugs available, Suction available, Patient being monitored and Timeout performed Patient Re-evaluated:Patient Re-evaluated prior to induction Oxygen Delivery Method: Circle system utilized Preoxygenation: Pre-oxygenation with 100% oxygen Induction Type: IV induction Ventilation: Mask ventilation without difficulty Laryngoscope Size: Glidescope and 3 Grade View: Grade I Tube type: Parker flex tip Tube size: 7.0 mm Number of attempts: 1 Airway Equipment and Method: Stylet and Video-laryngoscopy Placement Confirmation: ETT inserted through vocal cords under direct vision,  positive ETCO2 and breath sounds checked- equal and bilateral Secured at: 21 cm Tube secured with: Tape Dental Injury: Teeth and Oropharynx as per pre-operative assessment  Comments: Elective Glidescope due to deviated trachea and large thyroid. PreO2. Smooth IV induction. Easy mask. DL X 1 with Mac 3 Glidescope. Grade 1 view. ATOI. BBS = + ETCO2. ETT secured at 21 cm at the lip

## 2020-06-06 NOTE — Op Note (Signed)
Procedure Note  Pre-operative Diagnosis:  Multinodular thyroid goiter with tracheal deviation  Post-operative Diagnosis:  same  Surgeon:  Darnell Level, MD  Assistant:  none   Procedure:  Total thyroidectomy  Anesthesia:  General  Estimated Blood Loss:  minimal  Drains: none         Specimen: thyroid to pathology  Indications:  Patient is referred by Dr. Crista Curb for surgical evaluation and management of multinodular thyroid goiter with tracheal deviation and compressive symptoms. Patient is accompanied by her mother. She has had issues with her thyroid dating back approximately 8 years. She has never been on medication. She has had a gradual increase in size of the thyroid gland. Most prominent is a large 6 cm nodule in the right thyroid lobe. Ultrasound shows this to be a complex nodule with cystic and solid components. Fine-needle aspiration biopsy has been benign. Left thyroid lobe is slightly enlarged with multiple subcentimeter nodules. Overall thyroid function is normal with a TSH level of 0.6. Patient has developed a moderate globus sensation. She is also noted intermittent episodes of hoarseness and overall she has had some change in the quality of her voice. She does sing at church. Patient was previously evaluated for surgery but this was postponed due to the Covid pandemic. Patient is now referred to my practice for consideration for total thyroidectomy.  Procedure Details: Procedure was done in OR #4 at the [facility]. The patient was brought to the operating room and placed in a supine position on the operating room table. Following administration of general anesthesia, the patient was positioned and then prepped and draped in the usual aseptic fashion. After ascertaining that an adequate level of anesthesia had been achieved, a small Kocher incision was made with #15 blade. Dissection was carried through subcutaneous tissues and platysma.Hemostasis was  achieved with the electrocautery. Skin flaps were elevated cephalad and caudad from the thyroid notch to the sternal notch. A Mahorner self-retaining retractor was placed for exposure. Strap muscles were incised in the midline and dissection was begun on the left side.  Strap muscles were reflected laterally.  Left thyroid lobe was moderately enlarged and firm.  The left lobe was gently mobilized with blunt dissection. Superior pole vessels were dissected out and divided individually between small and medium ligaclips with the harmonic scalpel. The thyroid lobe was rolled anteriorly. Branches of the inferior thyroid artery were divided between small ligaclips with the harmonic scalpel. Inferior venous tributaries were divided between ligaclips. Both the superior and inferior parathyroid glands were identified and preserved on their vascular pedicles. The recurrent laryngeal nerve was identified and preserved along its course. The ligament of Allyson Sabal was released with the electrocautery and the gland was mobilized onto the anterior trachea. Isthmus was mobilized across the midline. There was a moderate sized pyramidal lobe present which extended along the right side of the thyroid cartilage and was multinodular. Dry pack was placed in the left neck.  The right thyroid lobe was gently mobilized with blunt dissection. Right thyroid lobe was markedly enlarged with a dominant mass in the middle and anterior portions of the lobe.  This caused significant distorsion of her anatomy and deviation of the trachea. Superior pole vessels were dissected out and divided between small and medium ligaclips with the Harmonic scalpel. Superior parathyroid was identified and preserved. Inferior venous tributaries were divided between medium ligaclips with the harmonic scalpel. The right thyroid lobe was rolled anteriorly and the branches of the inferior thyroid artery divided  between small ligaclips. The right recurrent laryngeal nerve  was identified and preserved along its course. The ligament of Allyson Sabal was released with the electrocautery. The right thyroid lobe was mobilized onto the anterior trachea and the remainder of the thyroid was dissected off the anterior trachea and the thyroid was completely excised. A suture was used to mark each thyroid lobe. The entire thyroid gland was submitted to pathology for review.  The neck was irrigated with warm saline. Fibrillar was placed throughout the operative field. Strap muscles were approximated in the midline with interrupted 3-0 Vicryl sutures. Platysma was closed with interrupted 3-0 Vicryl sutures. Skin was closed with a running 4-0 Monocryl subcuticular suture. Wound was washed and Dermabond was applied. The patient was awakened from anesthesia and brought to the recovery room. The patient tolerated the procedure well.   Darnell Level, MD [facility] Office: [phone]

## 2020-06-06 NOTE — Transfer of Care (Signed)
Immediate Anesthesia Transfer of Care Note  Patient: Monica Sloan  Procedure(s) Performed: TOTAL THYROIDECTOMY (N/A Neck)  Patient Location: PACU  Anesthesia Type:General  Level of Consciousness: awake, alert , oriented and patient cooperative  Airway & Oxygen Therapy: Patient Spontanous Breathing and Patient connected to face mask oxygen  Post-op Assessment: Report given to RN, Post -op Vital signs reviewed and stable and Patient moving all extremities  Post vital signs: Reviewed and stable  Last Vitals:  Vitals Value Taken Time  BP    Temp    Pulse 88 06/06/20 1016  Resp 12 06/06/20 1016  SpO2 100 % 06/06/20 1016  Vitals shown include unvalidated device data.  Last Pain:  Vitals:   06/06/20 0551  TempSrc: Oral         Complications: No complications documented.

## 2020-06-06 NOTE — Anesthesia Preprocedure Evaluation (Addendum)
Anesthesia Evaluation  Patient identified by MRN, date of birth, ID band Patient awake    Reviewed: Allergy & Precautions, NPO status , Patient's Chart, lab work & pertinent test results  Airway Mallampati: I  TM Distance: >3 FB Neck ROM: Full    Dental  (+) Teeth Intact, Dental Advisory Given, Chipped,    Pulmonary asthma , Current Smoker,    breath sounds clear to auscultation       Cardiovascular negative cardio ROS   Rhythm:Regular Rate:Normal     Neuro/Psych  Headaches, PSYCHIATRIC DISORDERS Anxiety Depression    GI/Hepatic Neg liver ROS, GERD  ,  Endo/Other  negative endocrine ROS  Renal/GU negative Renal ROS     Musculoskeletal   Abdominal Normal abdominal exam  (+)   Peds  Hematology negative hematology ROS (+) Sickle cell trait ,   Anesthesia Other Findings   Reproductive/Obstetrics                            Anesthesia Physical Anesthesia Plan  ASA: II  Anesthesia Plan: General   Post-op Pain Management:    Induction: Intravenous  PONV Risk Score and Plan: 3 and Ondansetron, Dexamethasone and Midazolam  Airway Management Planned: Oral ETT  Additional Equipment: None  Intra-op Plan:   Post-operative Plan: Extubation in OR  Informed Consent: I have reviewed the patients History and Physical, chart, labs and discussed the procedure including the risks, benefits and alternatives for the proposed anesthesia with the patient or authorized representative who has indicated his/her understanding and acceptance.     Dental advisory given  Plan Discussed with: CRNA  Anesthesia Plan Comments:        Anesthesia Quick Evaluation

## 2020-06-06 NOTE — Anesthesia Postprocedure Evaluation (Signed)
Anesthesia Post Note  Patient: Monica Sloan  Procedure(s) Performed: TOTAL THYROIDECTOMY (N/A Neck)     Patient location during evaluation: PACU Anesthesia Type: General Level of consciousness: awake and alert Pain management: pain level controlled Vital Signs Assessment: post-procedure vital signs reviewed and stable Respiratory status: spontaneous breathing, nonlabored ventilation, respiratory function stable and patient connected to nasal cannula oxygen Cardiovascular status: blood pressure returned to baseline and stable Postop Assessment: no apparent nausea or vomiting Anesthetic complications: no   No complications documented.  Last Vitals:  Vitals:   06/06/20 1115 06/06/20 1136  BP: (!) 142/89 (!) 142/92  Pulse: 64 84  Resp: (!) 5 14  Temp: 36.4 C 36.7 C  SpO2: 97% 100%    Last Pain:  Vitals:   06/06/20 1136  TempSrc: Oral  PainSc:                  Shelton Silvas

## 2020-06-07 ENCOUNTER — Encounter (HOSPITAL_COMMUNITY): Payer: Self-pay | Admitting: Surgery

## 2020-06-07 DIAGNOSIS — E042 Nontoxic multinodular goiter: Secondary | ICD-10-CM | POA: Diagnosis not present

## 2020-06-07 LAB — BASIC METABOLIC PANEL
Anion gap: 11 (ref 5–15)
BUN: 12 mg/dL (ref 6–20)
CO2: 24 mmol/L (ref 22–32)
Calcium: 9.1 mg/dL (ref 8.9–10.3)
Chloride: 103 mmol/L (ref 98–111)
Creatinine, Ser: 0.85 mg/dL (ref 0.44–1.00)
GFR, Estimated: >60 mL/min (ref >60)
Glucose, Bld: 114 mg/dL — ABNORMAL HIGH (ref 70–99)
Potassium: 4 mmol/L (ref 3.5–5.1)
Sodium: 138 mmol/L (ref 135–145)

## 2020-06-07 MED ORDER — OXYCODONE HCL 5 MG PO TABS: 5.0000 mg | ORAL_TABLET | ORAL | 0 refills | Status: DC | PRN

## 2020-06-07 MED ORDER — LEVOTHYROXINE SODIUM 100 MCG PO TABS: 100.0000 ug | ORAL_TABLET | Freq: Every day | ORAL | 2 refills | Status: AC

## 2020-06-07 MED ORDER — CALCIUM CARBONATE ANTACID 500 MG PO CHEW: 2.0000 | CHEWABLE_TABLET | Freq: Two times a day (BID) | ORAL | 1 refills | Status: AC

## 2020-06-07 NOTE — Plan of Care (Signed)
  Problem: Education: Goal: Required Educational Video(s) Outcome: Completed/Met   Problem: Clinical Measurements: Goal: Ability to maintain clinical measurements within normal limits will improve Outcome: Completed/Met Goal: Postoperative complications will be avoided or minimized Outcome: Completed/Met   Problem: Skin Integrity: Goal: Demonstration of wound healing without infection will improve Outcome: Completed/Met

## 2020-06-07 NOTE — Discharge Summary (Signed)
Physician Discharge Summary [facility]  Patient ID: Monica Sloan MRN: [id number] DOB/AGE: 1985/09/25 34 y.o.  Admit date: 06/06/2020  Discharge date: 06/07/2020  Discharge Diagnoses:  Principal Problem:   Multinodular goiter Active Problems:   Tracheal deviation   Multinodular goiter (nontoxic)   Discharged Condition: good  Hospital Course: Patient was admitted for observation following thyroid surgery.  Post op course was uncomplicated.  Pain was well controlled.  Tolerated diet.  Post op calcium level on morning following surgery was 9.1 mg/dl.  Patient was prepared for discharge home on POD#1.  Consults: None  Treatments: surgery: total thyroidectomy  Discharge Exam: Blood pressure 120/75, pulse (!) 48, temperature 98.1 F (36.7 C), temperature source Oral, resp. rate 16, height 5' 1.5" (1.562 m), weight 92.5 kg, last menstrual period 05/13/2020, SpO2 100 %, unknown if currently breastfeeding. HEENT - clear Neck - wound dry and intact; mild STS; Dermabond in place; voice normal Chest - clear bilaterally Cor - RRR  Disposition: Home  Discharge Instructions    Diet - low sodium heart healthy   Complete by: As directed    Discharge instructions   Complete by: As directed    [facility]  THYROID & PARATHYROID SURGERY:  POST-OP INSTRUCTIONS  Always review your discharge instruction sheet from the facility where your surgery was performed.  A prescription for pain medication may be given to you upon discharge.  Take your pain medication as prescribed.  If narcotic pain medicine is not needed, then you may take acetaminophen (Tylenol) or ibuprofen (Advil) as needed.  Take your usually prescribed medications unless otherwise directed.  If you need a refill on your pain medication, please contact our office during regular business hours.  Prescriptions cannot be processed by our office after 5 pm or on weekends.  Start with  a light diet upon arrival home, such as soup and crackers or toast.  Be sure to drink plenty of fluids daily.  Resume your normal diet the day after surgery.  Most patients will experience some swelling and bruising on the chest and neck area.  Ice packs will help.  Swelling and bruising can take several days to resolve.   It is common to experience some constipation after surgery.  Increasing fluid intake and taking a stool softener (Colace) will usually help or prevent this problem.  A mild laxative (Milk of Magnesia or Miralax) should be taken according to package directions if there has been no bowel movement after 48 hours.  You have steri-strips and a gauze dressing over your incision.  You may remove the gauze bandage on the second day after surgery, and you may shower at that time.  Leave your steri-strips (small skin tapes) in place directly over the incision.  These strips should remain on the skin for 5-7 days and then be removed.  You may get them wet in the shower and pat them dry.  You may resume regular (light) daily activities beginning the next day (such as daily self-care, walking, climbing stairs) gradually increasing activities as tolerated.  You may have sexual intercourse when it is comfortable.  Refrain from any heavy lifting or straining until approved by your doctor.  You may drive when you no longer are taking prescription pain medication, you can comfortably wear a seatbelt, and you can safely maneuver your car and apply brakes.  You should see your doctor in the office for a follow-up appointment approximately three weeks after your surgery.  Make sure that you call for this appointment within a day or two after you arrive home to insure a convenient appointment time.  WHEN TO CALL YOUR DOCTOR: -- Fever greater than 101.5 -- Inability to urinate -- Nausea and/or vomiting - persistent -- Extreme swelling or bruising -- Continued bleeding from incision -- Increased pain,  redness, or drainage from the incision -- Difficulty swallowing or breathing -- Muscle cramping or spasms -- Numbness or tingling in hands or around lips  The clinic staff is available to answer your questions during regular business hours.  Please don't hesitate to call and ask to speak to one of the nurses if you have concerns.  Darnell Level, MD [facility] Office: [phone]   Increase activity slowly   Complete by: As directed    No dressing needed   Complete by: As directed      Allergies as of 06/07/2020      Reactions   Chocolate Swelling   Peanuts [peanut Oil] Hives, Swelling   Wine [alcohol] Swelling      Medication List    TAKE these medications   benzonatate 100 MG capsule Commonly known as: TESSALON Take 1 capsule (100 mg total) by mouth 3 (three) times daily as needed for cough.   calcium carbonate 500 MG chewable tablet Commonly known as: TUMS - dosed in mg elemental calcium Chew 1 tablet by mouth 2 (two) times daily as needed for indigestion or heartburn. What changed: Another medication with the same name was added. Make sure you understand how and when to take each.   calcium carbonate 500 MG chewable tablet Commonly known as: Tums Chew 2 tablets (400 mg of elemental calcium total) by mouth 2 (two) times daily. What changed: You were already taking a medication with the same name, and this prescription was added. Make sure you understand how and when to take each.   cyclobenzaprine 10 MG tablet Commonly known as: FLEXERIL Take 1 tablet (10 mg total) by mouth 2 (two) times daily as needed for muscle spasms.   doxycycline 100 MG capsule Commonly known as: VIBRAMYCIN Take 1 capsule (100 mg total) by mouth 2 (two) times daily. One po bid x 7 days   fluticasone 50 MCG/ACT nasal spray Commonly known as: FLONASE Place 1 spray into both nostrils daily as needed for allergies or rhinitis.   Hair/Skin/Nails Tabs Take 1 tablet by mouth  daily.   ibuprofen 800 MG tablet Commonly known as: ADVIL Take 1 tablet (800 mg total) by mouth every 8 (eight) hours as needed.   levothyroxine 100 MCG tablet Commonly known as: Synthroid Take 1 tablet (100 mcg total) by mouth daily.   methylPREDNISolone 4 MG Tbpk tablet Commonly known as: MEDROL DOSEPAK Use as directed   montelukast 10 MG tablet Commonly known as: SINGULAIR Take 10 mg by mouth at bedtime as needed (allergies).   multivitamin with minerals Tabs tablet Take 1 tablet by mouth daily.   naproxen 500 MG tablet Commonly known as: Naprosyn Take 1 tablet (500 mg total) by mouth 2 (two) times daily with a meal. As needed for pain   oxyCODONE 5 MG immediate release tablet Commonly known as: Oxy IR/ROXICODONE Take 1-2 tablets (5-10 mg total) by mouth every 4 (four) hours as needed for moderate pain.   predniSONE 20 MG tablet Commonly known as: DELTASONE 2 tabs po daily x 4 days   prenatal multivitamin Tabs tablet Take 1 tablet by mouth daily at 12 noon.  vitamin B-12 1000 MCG tablet Commonly known as: CYANOCOBALAMIN Take 1,000 mcg by mouth daily.   vitamin E 180 MG (400 UNITS) capsule Take 400 Units by mouth daily.            Discharge Care Instructions  (From admission, onward)         Start     Ordered   06/07/20 0000  No dressing needed        06/07/20 0725          Follow-up Information    Darnell Level, MD. Schedule an appointment as soon as possible for a visit in 3 week(s).   Specialty: General Surgery Contact information: [address] [place] [address] [phone]               Darnell Level, MD [facility] Office: [phone]   Signed: Darnell Level 06/07/2020, 7:26 AM

## 2020-06-08 LAB — SURGICAL PATHOLOGY

## 2020-06-08 NOTE — Progress Notes (Signed)
Please contact patient and notify of benign pathology results.  Jameshia Hayashida M. Ondre Salvetti, MD, FACS [facility] Office: [phone]   

## 2022-03-02 ENCOUNTER — Ambulatory Visit
Admission: EM | Admit: 2022-03-02 | Discharge: 2022-03-02 | Disposition: A | Discharge status: Home / Self Care | Payer: Medicaid Other | Attending: Nurse Practitioner | Admitting: Nurse Practitioner

## 2022-03-02 DIAGNOSIS — R051 Acute cough: Secondary | ICD-10-CM

## 2022-03-02 DIAGNOSIS — U071 COVID-19: Secondary | ICD-10-CM

## 2022-03-02 MED ORDER — PAXLOVID (300/100) 20 X 150 MG & 10 X 100MG PO TBPK: 3.0000 | ORAL_TABLET | Freq: Two times a day (BID) | ORAL | 0 refills | Status: AC

## 2022-03-02 MED ORDER — PSEUDOEPH-BROMPHEN-DM 30-2-10 MG/5ML PO SYRP: 10.0000 mL | ORAL_SOLUTION | ORAL | 0 refills | Status: AC | PRN

## 2022-03-02 NOTE — ED Provider Notes (Signed)
[facility]    CSN: [id number] Arrival date & time: 03/02/22  0955      History   Chief Complaint No chief complaint on file.   HPI Monica Sloan is a 37 y.o. female.   Subjective:   Monica Sloan is a 37 y.o. female who presents for evaluation of COVID.  Patient reports that she had a "cold" about 2 weeks ago.  All of her symptoms had improved except for a mild lingering cough.  The cough worsened 2 days ago and she then began to develop chills, chest congestion, headache, body aches, runny nose, nasal congestion, vomiting and fevers up to 100.2 degrees. She denies any sore throat, wheezing, shortness of breath, nausea or diarrhea. She is drinking plenty of fluids.  She has been taking Delsym, ibuprofen and Tylenol.  Home COVID test +1-day ago.  Patient has a history of COVID in the past.  She has had a COVID-vaccine with 2 boosters.    The following portions of the patient's history were reviewed and updated as appropriate: allergies, current medications, past family history, past medical history, past social history, past surgical history, and problem list.          Past Medical History:  Diagnosis Date   Anxiety    Asthma    Depression    Family history of adverse reaction to anesthesia    mother- difficulty waking up    GERD (gastroesophageal reflux disease)    Headache    Pneumonia    hx of    Pre-diabetes    Seasonal allergies    Sickle cell trait (HCC)    SVD (spontaneous vaginal delivery) 09/06/2016   Thyroid disease     Patient Active Problem List   Diagnosis Date Noted   Multinodular goiter (nontoxic) 06/06/2020   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   SVD (spontaneous vaginal delivery) 09/06/2016   Indication for care in labor or delivery 07/07/2013   Multinodular goiter 10/01/2012   Asthma, intermittent controlled 04/17/2011    Past Surgical History:  Procedure Laterality Date   DILATION AND CURETTAGE OF UTERUS  2009   x's 2     THYROIDECTOMY N/A 06/06/2020   Procedure: TOTAL THYROIDECTOMY;  Surgeon: Armandina Gemma, MD;  Location: [facility];  Service: General;  Laterality: N/A;  120/RM6    OB History     Gravida  5   Para  3   Term  3   Preterm  0   AB  2   Living  3      SAB  2   IAB  0   Ectopic  0   Multiple  0   Live Births  3            Home Medications    Prior to Admission medications   Medication Sig Start Date End Date Taking? Authorizing Provider  brompheniramine-pseudoephedrine-DM 30-2-10 MG/5ML syrup Take 10 mLs by mouth every 4 (four) hours as needed (cough and congestion). 03/02/22  Yes Enrique Sack, FNP  nirmatrelvir & ritonavir (PAXLOVID, 300/100,) 20 x 150 MG & 10 x 100MG  TBPK Take 3 tablets by mouth 2 (two) times daily for 5 days. 03/02/22 03/07/22 Yes Enrique Sack, FNP  calcium carbonate (TUMS - DOSED IN MG ELEMENTAL CALCIUM) 500 MG chewable tablet Chew 1 tablet by mouth 2 (two) times daily as needed for indigestion or heartburn.    [provider]  calcium carbonate (TUMS) 500 MG chewable tablet Chew 2 tablets (400 mg of elemental calcium  total) by mouth 2 (two) times daily. 06/07/20   Darnell Level, MD  fluticasone (FLONASE) 50 MCG/ACT nasal spray Place 1 spray into both nostrils daily as needed for allergies or rhinitis.    [provider]  levothyroxine (SYNTHROID) 100 MCG tablet Take 1 tablet (100 mcg total) by mouth daily. 06/07/20 06/07/21  Darnell Level, MD  montelukast (SINGULAIR) 10 MG tablet Take 10 mg by mouth at bedtime as needed (allergies).    [provider]  Multiple Vitamin (MULTIVITAMIN WITH MINERALS) TABS tablet Take 1 tablet by mouth daily.    [provider]  Multiple Vitamins-Minerals (HAIR/SKIN/NAILS) TABS Take 1 tablet by mouth daily.    [provider]  naproxen (NAPROSYN) 500 MG tablet Take 1 tablet (500 mg total) by mouth 2 (two) times daily with a meal. As needed for pain 02/11/18   Linwood Dibbles, MD   vitamin B-12 (CYANOCOBALAMIN) 1000 MCG tablet Take 1,000 mcg by mouth daily.    [provider]  vitamin E 180 MG (400 UNITS) capsule Take 400 Units by mouth daily.    [provider]    Family History Family History  Problem Relation Age of Onset   Arthritis Mother    Ovarian cancer Mother    Heart murmur Mother    Hypertension Mother    Arthritis Father    Hyperlipidemia Father    Diabetes Father    Emotional abuse Father        Depression   Colon cancer Other        Maternal Second Cousin   Breast cancer Other        Maternal second cousin   Prostate cancer Paternal Grandfather    Diabetes Paternal Grandfather    Lung cancer Other        Maternal Great Uncle   Heart disease Cousin    Hypertension Maternal Grandmother    Kidney disease Maternal Grandmother     Social History Social History   Tobacco Use   Smoking status: Some Days    Types: Cigarettes   Smokeless tobacco: Never  Vaping Use   Vaping Use: Some days   Substances: Nicotine, Flavoring  Substance Use Topics   Alcohol use: No   Drug use: No     Allergies   Cocoa, Chocolate, Peanuts [peanut oil], and Wine [alcohol]   Review of Systems Review of Systems  Constitutional:  Positive for chills and fever. Negative for appetite change, diaphoresis and fatigue.  HENT:  Positive for congestion and rhinorrhea. Negative for sore throat.   Respiratory:  Positive for cough. Negative for shortness of breath and wheezing.   Gastrointestinal:  Positive for vomiting. Negative for diarrhea and nausea.  Musculoskeletal:  Negative for myalgias.  Neurological:  Positive for headaches.  All other systems reviewed and are negative.    Physical Exam Triage Vital Signs ED Triage Vitals  Enc Vitals Group     BP 03/02/22 1057 131/86     Pulse Rate 03/02/22 1057 79     Resp 03/02/22 1057 16     Temp 03/02/22 1057 100 F (37.8 C)     Temp Source 03/02/22 1057 Oral     SpO2 03/02/22 1057 99 %      Weight --      Height --      Head Circumference --      Peak Flow --      Pain Score 03/02/22 1056 1     Pain Loc --  Pain Edu? --      Excl. in [place]? --    No data found.  Updated Vital Signs BP 131/86 (BP Location: Left Arm)   Pulse 79   Temp 100 F (37.8 C) (Oral)   Resp 16   LMP 02/12/2022 (Approximate)   SpO2 99%   Breastfeeding No   Visual Acuity Right Eye Distance:   Left Eye Distance:   Bilateral Distance:    Right Eye Near:   Left Eye Near:    Bilateral Near:     Physical Exam Vitals reviewed.  Constitutional:      General: She is not in acute distress.    Appearance: Normal appearance. She is normal weight. She is not ill-appearing, toxic-appearing or diaphoretic.  HENT:     Head: Normocephalic.     Nose: Congestion present.     Mouth/Throat:     Mouth: Mucous membranes are moist.  Eyes:     Conjunctiva/sclera: Conjunctivae normal.  Cardiovascular:     Rate and Rhythm: Normal rate and regular rhythm.  Pulmonary:     Effort: Pulmonary effort is normal.     Breath sounds: Normal breath sounds.  Musculoskeletal:        General: Normal range of motion.     Cervical back: Normal range of motion and neck supple.  Lymphadenopathy:     Cervical: No cervical adenopathy.  Skin:    General: Skin is warm and dry.  Neurological:     General: No focal deficit present.     Mental Status: She is alert and oriented to person, place, and time.  Psychiatric:        Mood and Affect: Mood normal.        Behavior: Behavior normal.      UC Treatments / Results  Labs (all labs ordered are listed, but only abnormal results are displayed) Labs Reviewed - No data to display  EKG   Radiology No results found.  Procedures Procedures (including critical care time)  Medications Ordered in UC Medications - No data to display  Initial Impression / Assessment and Plan / UC Course  I have reviewed the triage vital signs and the nursing  notes.  Pertinent labs & imaging results that were available during my care of the patient were reviewed by me and considered in my medical decision making (see chart for details).    37 yo female with history of hypothyroidism and sickle cell trait presenting with chills, chest congestion, headache, body aches, runny nose, nasal congestion, vomiting, cough and fevers for the past couple of days. Home + COVID test 1 day ago.  Patient is alert and oriented.  Febrile at 100 degrees.  Nontoxic.  Physical exam as above.  She denies any history of renal disease.  Paxlovid and Bromfed-DM prescribed.  Suggested symptomatic OTC remedies. Nasal saline spray for congestion. Discussed isolation per CDC recommendations. Follow up as needed.  Today's evaluation has revealed no signs of a dangerous process. Discussed diagnosis with patient and/or guardian. Patient and/or guardian aware of their diagnosis, possible red flag symptoms to watch out for and need for close follow up. Patient and/or guardian understands verbal and written discharge instructions. Patient and/or guardian comfortable with plan and disposition.  Patient and/or guardian has a clear mental status at this time, good insight into illness (after discussion and teaching) and has clear judgment to make decisions regarding their care  Documentation was completed with the aid of voice recognition software. Transcription may contain typographical  errors. Final Clinical Impressions(s) / UC Diagnoses   Final diagnoses:  Acute cough  COVID-19     Discharge Instructions      You have COVID which is caused by a virus. Antibiotic medicines are not prescribed for viral infections. This is because antibiotics are designed to kill bacteria. They do not kill viruses. Take medications as prescribed. You may take tylenol or ibuprofen as needed for fevers/headache/body aches. Make sure to drink plenty of fluids.   You should isolate for 5 days from when  your symptoms started.  You can discontinue isolation on Tuesday if you do not have a fever and symptoms are some better.  You should continue to wear a mask for an additional 5 days to prevent spreading the virus to others.   Go to the ED immediately if you get worse or have any other symptoms.           ED Prescriptions     Medication Sig Dispense Auth. Provider   nirmatrelvir & ritonavir (PAXLOVID, 300/100,) 20 x 150 MG & 10 x 100MG  TBPK Take 3 tablets by mouth 2 (two) times daily for 5 days. 30 tablet , FNP   brompheniramine-pseudoephedrine-DM 30-2-10 MG/5ML syrup Take 10 mLs by mouth every 4 (four) hours as needed (cough and congestion). 200 mL 02-08-1973, FNP      PDMP not reviewed this encounter.   Lurline Idol, Lurline Idol 03/02/22 1130

## 2022-03-02 NOTE — ED Triage Notes (Signed)
Patient presents to UC for positive covid test. Fever since yesterday, runny nose today, HA x 2 days, cough x 1 week. Treating symptoms with delsym, motrin, tylenol. Last dose of ibuprofen at 0400.

## 2022-03-02 NOTE — Discharge Instructions (Addendum)
You have COVID which is caused by a virus. Antibiotic medicines are not prescribed for viral infections. This is because antibiotics are designed to kill bacteria. They do not kill viruses. Take medications as prescribed. You may take tylenol or ibuprofen as needed for fevers/headache/body aches. Make sure to drink plenty of fluids.   You should isolate for 5 days from when your symptoms started.  You can discontinue isolation on Tuesday if you do not have a fever and symptoms are some better.  You should continue to wear a mask for an additional 5 days to prevent spreading the virus to others.   Go to the ED immediately if you get worse or have any other symptoms.

## 2024-03-05 ENCOUNTER — Ambulatory Visit
Admission: RE | Admit: 2024-03-05 | Discharge: 2024-03-05 | Disposition: A | Discharge status: Home / Self Care | Source: Ambulatory Visit

## 2024-03-05 VITALS — BP 142/94 | HR 72 | Temp 98.2°F | Resp 17

## 2024-03-05 DIAGNOSIS — R6889 Other general symptoms and signs: Secondary | ICD-10-CM | POA: Diagnosis not present

## 2024-03-05 DIAGNOSIS — Z20828 Contact with and (suspected) exposure to other viral communicable diseases: Secondary | ICD-10-CM

## 2024-03-05 MED ORDER — OSELTAMIVIR PHOSPHATE 75 MG PO CAPS: 75.0000 mg | ORAL_CAPSULE | Freq: Two times a day (BID) | ORAL | 0 refills | Status: AC

## 2024-03-05 NOTE — ED Triage Notes (Signed)
 Pt c/o chills, sore throat, stuffy nose, fatigue for 1 day.  Her children have been sick with the flu.

## 2024-03-05 NOTE — Discharge Instructions (Signed)
" °  Influenza is a viral respiratory illness that commonly causes fever, chills, cough, sore throat, congestion, body aches, headache, and fatigue. Symptoms often start suddenly and can feel severe, especially during the first few days. Most people begin to improve within 3-7 days, though cough and fatigue may last longer. Treatment Tamiflu  (oseltamivir ): Take exactly as prescribed. This antiviral can shorten the duration of illness and reduce complications, especially when started early. Tylenol  (acetaminophen ): Use as needed for fever, headaches, and body aches. Do not exceed the recommended daily dose. Hydration: Drink plenty of fluids (water, electrolyte drinks, broth) to prevent dehydration. Honey: May help soothe cough and throat irritation (do not use in children under 1 year). OTC medications: May be used as needed if symptoms progress (for cough, congestion, sore throat). Avoid taking multiple products that contain acetaminophen . Home Care Get plenty of rest. Stay home and avoid close contact with others while sick. Use a humidifier or warm showers to help with congestion. Eat light meals as tolerated. What to Expect Fever and body aches usually improve within a few days. Cough and fatigue can last 1-2 weeks. Gradual improvement is expected. Seek Medical Care If You Develop: Shortness of breath or trouble breathing Chest pain Persistent or worsening fever Severe weakness or confusion Inability to keep fluids down Symptoms that worsen instead of improving Supportive care is key to recovery. Focus on rest, fluids, and symptom control while your body heals. "

## 2024-03-05 NOTE — ED Provider Notes (Signed)
 " [facility]    CSN: [id number] Arrival date & time: 03/05/24  1751      History   Chief Complaint Chief Complaint  Patient presents with   Influenza    Flu like symptoms: chills, cough, sore throat, stuffy nose - Entered by patient    HPI Monica Sloan is a 39 y.o. female.   Erlean has been experiencing symptoms for the past day, presents with a mild cough, stuffy nose, and a sore throat. She reported using saline nasal spray, steam inhalation, and drinking tea as home remedies. Chills were experienced last night, and she took Tylenol  after experiencing a headache. There have been no fevers, no significant body aches, and no shortness of breath or wheezing, despite a history of asthma. Starletta denied any abdominal pain, nausea, vomiting, or diarrhea.  All 3 of her children have had a diagnosis of flu a this week  The history is provided by the patient.  Influenza Presenting symptoms: cough, fatigue, headache, myalgias and sore throat   Presenting symptoms: no diarrhea, no fever, no nausea, no rhinorrhea and no shortness of breath   Associated symptoms: chills and nasal congestion   Associated symptoms: no ear pain     Past Medical History:  Diagnosis Date   Anxiety    Asthma    Depression    Family history of adverse reaction to anesthesia    mother- difficulty waking up    GERD (gastroesophageal reflux disease)    Headache    Pneumonia    hx of    Pre-diabetes    Seasonal allergies    Sickle cell trait    SVD (spontaneous vaginal delivery) 09/06/2016   Thyroid  disease     Patient Active Problem List   Diagnosis Date Noted   Multinodular goiter (nontoxic) 06/06/2020   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   SVD (spontaneous vaginal delivery) 09/06/2016   Indication for care in labor or delivery 07/07/2013   Multinodular goiter 10/01/2012   Intermittent asthma, well controlled 04/17/2011    Past Surgical History:  Procedure Laterality Date   DILATION  AND CURETTAGE OF UTERUS  2009   x's 2    THYROIDECTOMY N/A 06/06/2020   Procedure: TOTAL THYROIDECTOMY;  Surgeon: Eletha Boas, MD;  Location: [facility];  Service: General;  Laterality: N/A;  120/RM6    OB History     Gravida  5   Para  3   Term  3   Preterm  0   AB  2   Living  3      SAB  2   IAB  0   Ectopic  0   Multiple  0   Live Births  3            Home Medications    Prior to Admission medications  Medication Sig Start Date End Date Taking? Authorizing Provider  oseltamivir  (TAMIFLU ) 75 MG capsule Take 1 capsule (75 mg total) by mouth every 12 (twelve) hours. 03/05/24  Yes Leatrice Vernell HERO, NP  brompheniramine-pseudoephedrine-DM 30-2-10 MG/5ML syrup Take 10 mLs by mouth every 4 (four) hours as needed (cough and congestion). Patient not taking: Reported on 03/05/2024 03/02/22   Murrill, Samantha, FNP  calcium  carbonate (TUMS - DOSED IN MG ELEMENTAL CALCIUM ) 500 MG chewable tablet Chew 1 tablet by mouth 2 (two) times daily as needed for indigestion or heartburn.    [provider]  calcium  carbonate (TUMS) 500 MG chewable tablet Chew 2 tablets (400 mg of elemental calcium   total) by mouth 2 (two) times daily. 06/07/20   Eletha Boas, MD  fluticasone  [facility]) 50 MCG/ACT nasal spray Place 1 spray into both nostrils daily as needed for allergies or rhinitis.    [provider]  levothyroxine  (SYNTHROID ) 100 MCG tablet Take 1 tablet (100 mcg total) by mouth daily. 06/07/20 06/07/21  Eletha Boas, MD  montelukast  (SINGULAIR ) 10 MG tablet Take 10 mg by mouth at bedtime as needed (allergies).    [provider]  Multiple Vitamin (MULTIVITAMIN WITH MINERALS) TABS tablet Take 1 tablet by mouth daily.    [provider]  Multiple Vitamins-Minerals (HAIR/SKIN/NAILS) TABS Take 1 tablet by mouth daily.    [provider]  naproxen  (NAPROSYN ) 500 MG tablet Take 1 tablet (500 mg total) by mouth 2 (two) times daily with a meal. As needed  for pain 02/11/18   Randol Simmonds, MD  vitamin B-12 (CYANOCOBALAMIN) 1000 MCG tablet Take 1,000 mcg by mouth daily.    [provider]  vitamin E 180 MG (400 UNITS) capsule Take 400 Units by mouth daily.    [provider]    Family History Family History  Problem Relation Age of Onset   Arthritis Mother    Ovarian cancer Mother    Heart murmur Mother    Hypertension Mother    Arthritis Father    Hyperlipidemia Father    Diabetes Father    Emotional abuse Father        Depression   Colon cancer Other        Maternal Second Cousin   Breast cancer Other        Maternal second cousin   Prostate cancer Paternal Grandfather    Diabetes Paternal Grandfather    Lung cancer Other        Maternal Great Uncle   Heart disease Cousin    Hypertension Maternal Grandmother    Kidney disease Maternal Grandmother     Social History Social History[1]   Allergies   Cocoa, Chocolate, Peanuts [peanut oil], and Wine [alcohol]   Review of Systems Review of Systems  Constitutional:  Positive for chills and fatigue. Negative for activity change, appetite change, diaphoresis and fever.  HENT:  Positive for congestion and sore throat. Negative for ear discharge, ear pain, postnasal drip, rhinorrhea, sinus pressure and sinus pain.   Respiratory:  Positive for cough. Negative for choking, chest tightness, shortness of breath and wheezing.   Gastrointestinal:  Negative for abdominal pain, constipation, diarrhea and nausea.  Musculoskeletal:  Positive for myalgias.  Neurological:  Positive for headaches.     Physical Exam Triage Vital Signs ED Triage Vitals [03/05/24 1803]  Encounter Vitals Group     BP (!) 142/94     Girls Systolic BP Percentile      Girls Diastolic BP Percentile      Boys Systolic BP Percentile      Boys Diastolic BP Percentile      Pulse Rate 72     Resp 17     Temp 98.2 F (36.8 C)     Temp Source Oral     SpO2 98 %     Weight      Height       Head Circumference      Peak Flow      Pain Score      Pain Loc      Pain Education      Exclude from Growth Chart    No data found.  Updated  Vital Signs BP (!) 142/94 (BP Location: Right Arm)   Pulse 72   Temp 98.2 F (36.8 C) (Oral)   Resp 17   LMP 01/15/2024   SpO2 98%   Visual Acuity Right Eye Distance:   Left Eye Distance:   Bilateral Distance:    Right Eye Near:   Left Eye Near:    Bilateral Near:     Physical Exam Vitals and nursing note reviewed.  Constitutional:      General: She is not in acute distress.    Appearance: Normal appearance. She is normal weight. She is not toxic-appearing.  HENT:     Head: Normocephalic.     Right Ear: Ear canal and external ear normal. A middle ear effusion is present. There is no impacted cerumen. No mastoid tenderness. No hemotympanum. Tympanic membrane is not injected, erythematous or bulging.     Left Ear: Ear canal and external ear normal. A middle ear effusion is present. There is no impacted cerumen. No mastoid tenderness. No hemotympanum. Tympanic membrane is not injected, erythematous or bulging.     Ears:     Comments: Serous fluid present behind bilat TM's     Nose: Rhinorrhea present. No congestion.     Mouth/Throat:     Mouth: Mucous membranes are moist.     Pharynx: Oropharynx is clear. Postnasal drip present. No oropharyngeal exudate or posterior oropharyngeal erythema.     Tonsils: No tonsillar exudate or tonsillar abscesses. 1+ on the right. 1+ on the left.     Comments:  Posterior oropharynx injection, cobblestoning, and post nasal drip  Eyes:     Conjunctiva/sclera: Conjunctivae normal.  Cardiovascular:     Rate and Rhythm: Normal rate and regular rhythm.     Heart sounds: Normal heart sounds.  Pulmonary:     Effort: Pulmonary effort is normal.     Breath sounds: Normal breath sounds.  Musculoskeletal:     Cervical back: Neck supple.  Lymphadenopathy:     Cervical: No cervical adenopathy.  Skin:     General: Skin is warm and dry.  Neurological:     Mental Status: She is alert and oriented to person, place, and time.  Psychiatric:        Mood and Affect: Mood normal.        Behavior: Behavior normal.      UC Treatments / Results  Labs (all labs ordered are listed, but only abnormal results are displayed) Labs Reviewed - No data to display  EKG   Radiology No results found.  Procedures Procedures (including critical care time)  Medications Ordered in UC Medications - No data to display  Initial Impression / Assessment and Plan / UC Course  I have reviewed the triage vital signs and the nursing notes.  Pertinent labs & imaging results that were available during my care of the patient were reviewed by me and considered in my medical decision making (see chart for details).     Flu like symptoms: Likely flu A given heavy household exposure.  The plan is to start her on Tamiflu . She is advised to maintain hydration and continue using over-the-counter medications as needed for symptom relief, such as Tylenol  for headaches and potential fevers, and Delsym or Robitussin for coughs if necessary. If congestion worsens, Mucinex is recommended. Amonie was advised to continue using home remedies like steam inhalation and tea with honey. Final Clinical Impressions(s) / UC Diagnoses   Final diagnoses:  Flu-like symptoms  Exposure to the  flu     Discharge Instructions       Influenza is a viral respiratory illness that commonly causes fever, chills, cough, sore throat, congestion, body aches, headache, and fatigue. Symptoms often start suddenly and can feel severe, especially during the first few days. Most people begin to improve within 3-7 days, though cough and fatigue may last longer. Treatment Tamiflu  (oseltamivir ): Take exactly as prescribed. This antiviral can shorten the duration of illness and reduce complications, especially when started early. Tylenol   (acetaminophen ): Use as needed for fever, headaches, and body aches. Do not exceed the recommended daily dose. Hydration: Drink plenty of fluids (water, electrolyte drinks, broth) to prevent dehydration. Honey: May help soothe cough and throat irritation (do not use in children under 1 year). OTC medications: May be used as needed if symptoms progress (for cough, congestion, sore throat). Avoid taking multiple products that contain acetaminophen . Home Care Get plenty of rest. Stay home and avoid close contact with others while sick. Use a humidifier or warm showers to help with congestion. Eat light meals as tolerated. What to Expect Fever and body aches usually improve within a few days. Cough and fatigue can last 1-2 weeks. Gradual improvement is expected. Seek Medical Care If You Develop: Shortness of breath or trouble breathing Chest pain Persistent or worsening fever Severe weakness or confusion Inability to keep fluids down Symptoms that worsen instead of improving Supportive care is key to recovery. Focus on rest, fluids, and symptom control while your body heals.     ED Prescriptions     Medication Sig Dispense Auth. Provider   oseltamivir  (TAMIFLU ) 75 MG capsule Take 1 capsule (75 mg total) by mouth every 12 (twelve) hours. 10 capsule Leatrice Vernell HERO, NP      PDMP not reviewed this encounter.     [1]  Social History Tobacco Use   Smoking status: Some Days    Types: Cigarettes   Smokeless tobacco: Never  Vaping Use   Vaping status: Some Days   Substances: Nicotine, Flavoring  Substance Use Topics   Alcohol use: No   Drug use: No     Leatrice Vernell HERO, NP 03/05/24 1947  "
